# Patient Record
Sex: Male | Born: 1965
Health system: Southern US, Community
[De-identification: ages and names within clinical notes are randomized; demographics above are authoritative.]

## PROBLEM LIST (undated history)

## (undated) DIAGNOSIS — E785 Hyperlipidemia, unspecified: Secondary | ICD-10-CM

## (undated) DIAGNOSIS — N200 Calculus of kidney: Secondary | ICD-10-CM

## (undated) HISTORY — PX: APPENDECTOMY: SHX54

## (undated) HISTORY — PX: LITHOTRIPSY: SUR834

## (undated) HISTORY — DX: Hyperlipidemia, unspecified: E78.5

## (undated) HISTORY — DX: Calculus of kidney: N20.0

---

## 1998-06-19 ENCOUNTER — Encounter: Payer: Self-pay | Admitting: Family Medicine

## 1998-06-19 ENCOUNTER — Ambulatory Visit (HOSPITAL_COMMUNITY): Admission: RE | Admit: 1998-06-19 | Discharge: 1998-06-19 | Payer: Self-pay | Admitting: Family Medicine

## 1998-06-21 ENCOUNTER — Encounter: Admission: RE | Admit: 1998-06-21 | Discharge: 1998-09-19 | Payer: Self-pay

## 2001-06-27 ENCOUNTER — Encounter: Payer: Self-pay | Admitting: Emergency Medicine

## 2001-06-27 ENCOUNTER — Emergency Department (HOSPITAL_COMMUNITY): Admission: EM | Admit: 2001-06-27 | Discharge: 2001-06-27 | Payer: Self-pay | Admitting: Emergency Medicine

## 2002-07-13 ENCOUNTER — Encounter: Payer: Self-pay | Admitting: Orthopedic Surgery

## 2002-07-13 ENCOUNTER — Ambulatory Visit (HOSPITAL_COMMUNITY): Admission: RE | Admit: 2002-07-13 | Discharge: 2002-07-13 | Payer: Self-pay | Admitting: Orthopedic Surgery

## 2009-12-31 ENCOUNTER — Emergency Department (HOSPITAL_COMMUNITY): Admission: EM | Admit: 2009-12-31 | Discharge: 2009-12-31 | Payer: Self-pay | Admitting: Emergency Medicine

## 2010-03-25 ENCOUNTER — Emergency Department (HOSPITAL_COMMUNITY)
Admission: EM | Admit: 2010-03-25 | Discharge: 2010-03-25 | Payer: Self-pay | Source: Home / Self Care | Admitting: Emergency Medicine

## 2010-03-25 LAB — CBC
HCT: 44.5 % (ref 39.0–52.0)
Hemoglobin: 15 g/dL (ref 13.0–17.0)
MCH: 28.9 pg (ref 26.0–34.0)
MCHC: 33.7 g/dL (ref 30.0–36.0)
MCV: 85.7 fL (ref 78.0–100.0)
Platelets: 247 10*3/uL (ref 150–400)
RBC: 5.19 MIL/uL (ref 4.22–5.81)
RDW: 12.6 % (ref 11.5–15.5)
WBC: 7.2 10*3/uL (ref 4.0–10.5)

## 2010-03-25 LAB — DIFFERENTIAL
Basophils Absolute: 0 10*3/uL (ref 0.0–0.1)
Basophils Relative: 0 % (ref 0–1)
Eosinophils Absolute: 0.1 10*3/uL (ref 0.0–0.7)
Eosinophils Relative: 1 % (ref 0–5)
Lymphocytes Relative: 40 % (ref 12–46)
Lymphs Abs: 2.9 10*3/uL (ref 0.7–4.0)
Monocytes Absolute: 0.5 10*3/uL (ref 0.1–1.0)
Monocytes Relative: 7 % (ref 3–12)
Neutro Abs: 3.8 10*3/uL (ref 1.7–7.7)
Neutrophils Relative %: 52 % (ref 43–77)

## 2010-03-25 LAB — POCT CARDIAC MARKERS
CKMB, poc: <1 ng/mL — ABNORMAL LOW (ref 1.0–8.0)
Myoglobin, poc: 77 ng/mL (ref 12–200)
Troponin i, poc: <0.05 ng/mL (ref 0.00–0.09)

## 2010-03-25 LAB — COMPREHENSIVE METABOLIC PANEL
ALT: 26 U/L (ref 0–53)
AST: 24 U/L (ref 0–37)
Albumin: 4 g/dL (ref 3.5–5.2)
CO2: 26 mEq/L (ref 19–32)
Chloride: 101 mEq/L (ref 96–112)
Creatinine, Ser: 0.95 mg/dL (ref 0.4–1.5)
GFR calc Af Amer: >60 mL/min (ref >60)
Potassium: 4.6 mEq/L (ref 3.5–5.1)
Sodium: 135 mEq/L (ref 135–145)
Total Bilirubin: 0.6 mg/dL (ref 0.3–1.2)

## 2013-06-16 ENCOUNTER — Emergency Department (HOSPITAL_COMMUNITY)
Admission: EM | Admit: 2013-06-16 | Discharge: 2013-06-16 | Disposition: A | Discharge status: Home / Self Care | Payer: 59 | Source: Home / Self Care | Attending: Family Medicine | Admitting: Family Medicine

## 2013-06-16 ENCOUNTER — Encounter (HOSPITAL_COMMUNITY): Payer: Self-pay | Admitting: Emergency Medicine

## 2013-06-16 DIAGNOSIS — Z5689 Other problems related to employment: Secondary | ICD-10-CM

## 2013-06-16 DIAGNOSIS — L237 Allergic contact dermatitis due to plants, except food: Secondary | ICD-10-CM

## 2013-06-16 DIAGNOSIS — L255 Unspecified contact dermatitis due to plants, except food: Secondary | ICD-10-CM

## 2013-06-16 MED ORDER — PREDNISONE 5 MG PO KIT: PACK | ORAL | Status: DC

## 2013-06-16 MED ORDER — TRIAMCINOLONE ACETONIDE 0.1 % EX CREA: 1.0000 "application " | TOPICAL_CREAM | Freq: Two times a day (BID) | CUTANEOUS | Status: DC

## 2013-06-16 NOTE — ED Provider Notes (Signed)
Roy Jenkins is a 48 y.o. male who presents to Urgent Care today for rash. Patient was exposed to poison ivy at work. 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 This occurred 2 days ago. Yesterday he developed a rash across trunk and extremities. It is itchy and consistent with prior episodes of poison ivy. He denies any lip or tongue swelling or trouble breathing. No fevers or chills nausea vomiting or diarrhea. No medications tried yet. Symptoms are moderate.   History reviewed. No pertinent past medical history. History  Substance Use Topics  . Smoking status: Never Smoker   . Smokeless tobacco: Not on file  . Alcohol Use: Yes   ROS as above Medications: No current facility-administered medications for this encounter.   Current Outpatient Prescriptions  Medication Sig Dispense Refill  . PredniSONE 5 MG KIT 12 days dosepack po  1 kit  0  . triamcinolone cream (KENALOG) 0.1 % Apply 1 application topically 2 (two) times daily.  30 g  0    Exam:  BP 125/69  Pulse 62  Temp(Src) 97.9 F (36.6 C) (Oral)  Resp 18  SpO2 100% Gen: Well NAD HEENT: EOMI,  MMM no mucocutaneous involvement. Lungs: Normal work of breathing. CTABL Heart: RRR no MRG Abd: NABS, Soft. NT, ND Exts: Brisk capillary refill, warm and well perfused.  Skin: Macular pruritic blanchable rash across trunk and extremities.  No results found for this or any previous visit (from the past 24 hour(s)). No results found.  Assessment and Plan: 48 y.o. male with poison ivy dermatitis. Plan for prednisone dose pack, triamcinolone cream, and Gold Bond Itch.  Discussed warning signs or symptoms. Please see discharge instructions. Patient expresses understanding.    Gregor Hams, MD 06/16/13 1248

## 2013-06-16 NOTE — ED Notes (Signed)
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

## 2013-06-16 NOTE — Discharge Instructions (Signed)
Thank you for coming in today. Take prednisone as directed for 12 days.  Use triamcinolone cream as needed.  Use over-the-counter Gold Bond Itch lotion as needed.  Call or go to the emergency room if you get worse, have trouble breathing, have chest pains, or palpitations.   Poison [employer] ivy is a inflammation of the skin (contact dermatitis) caused by touching the allergens on the leaves of the ivy plant following previous exposure to the plant. The rash usually appears 48 hours after exposure. The rash is usually bumps (papules) or blisters (vesicles) in a linear pattern. Depending on your own sensitivity, the rash may simply cause redness and itching, or it may also progress to blisters which may break open. These must be well cared for to prevent secondary bacterial (germ) infection, followed by scarring. Keep any open areas dry, clean, dressed, and covered with an antibacterial ointment if needed. The eyes may also get puffy. The puffiness is worst in the morning and gets better as the day progresses. This dermatitis usually heals without scarring, within 2 to 3 weeks without treatment. HOME CARE INSTRUCTIONS  Thoroughly wash with soap and water as soon as you have been exposed to poison ivy. You have about one half hour to remove the plant resin before it will cause the rash. This washing will destroy the oil or antigen on the skin that is causing, or will cause, the rash. Be sure to wash under your fingernails as any plant resin there will continue to spread the rash. Do not rub skin vigorously when washing affected area. Poison ivy cannot spread if no oil from the plant remains on your body. A rash that has progressed to weeping sores will not spread the rash unless you have not washed thoroughly. It is also important to wash any clothes you have been wearing as these may carry active allergens. The rash will return if you wear the unwashed clothing, even several days later. Avoidance of the  plant in the future is the best measure. Poison ivy plant can be recognized by the number of leaves. Generally, poison ivy has three leaves with flowering branches on a single stem. Diphenhydramine may be purchased over the counter and used as needed for itching. Do not drive with this medication if it makes you drowsy.Ask your caregiver about medication for children. SEEK MEDICAL CARE IF:  Open sores develop.  Redness spreads beyond area of rash.  You notice purulent (pus-like) discharge.  You have increased pain.  Other signs of infection develop (such as fever). Document Released: 02/09/2000 Document Revised: 05/06/2011 Document Reviewed: 12/28/2008 [facility] Patient Information 2014 [place], [place].

## 2014-03-05 ENCOUNTER — Encounter: Payer: Self-pay | Admitting: *Deleted

## 2014-03-09 ENCOUNTER — Encounter: Payer: Self-pay | Admitting: *Deleted

## 2017-01-02 DIAGNOSIS — Z23 Encounter for immunization: Secondary | ICD-10-CM | POA: Diagnosis not present

## 2017-01-02 DIAGNOSIS — Z0000 Encounter for general adult medical examination without abnormal findings: Secondary | ICD-10-CM | POA: Diagnosis not present

## 2017-01-02 DIAGNOSIS — E7800 Pure hypercholesterolemia, unspecified: Secondary | ICD-10-CM | POA: Diagnosis not present

## 2017-01-21 ENCOUNTER — Ambulatory Visit (INDEPENDENT_AMBULATORY_CARE_PROVIDER_SITE_OTHER): Payer: Self-pay | Admitting: Orthopaedic Surgery

## 2017-01-22 ENCOUNTER — Ambulatory Visit (INDEPENDENT_AMBULATORY_CARE_PROVIDER_SITE_OTHER): Payer: 59 | Admitting: Orthopaedic Surgery

## 2017-01-22 ENCOUNTER — Encounter (INDEPENDENT_AMBULATORY_CARE_PROVIDER_SITE_OTHER): Payer: Self-pay | Admitting: Orthopaedic Surgery

## 2017-01-22 ENCOUNTER — Ambulatory Visit (INDEPENDENT_AMBULATORY_CARE_PROVIDER_SITE_OTHER): Payer: 59

## 2017-01-22 DIAGNOSIS — M25561 Pain in right knee: Secondary | ICD-10-CM

## 2017-01-22 DIAGNOSIS — G8929 Other chronic pain: Secondary | ICD-10-CM | POA: Diagnosis not present

## 2017-01-22 DIAGNOSIS — M7061 Trochanteric bursitis, right hip: Secondary | ICD-10-CM | POA: Diagnosis not present

## 2017-01-22 DIAGNOSIS — M25551 Pain in right hip: Secondary | ICD-10-CM | POA: Diagnosis not present

## 2017-01-22 NOTE — Progress Notes (Signed)
Office Visit Note   Patient: Roy Jenkins           Date of Birth: Sep 15, 1965           MRN: [id number] Visit Date: 01/22/2017              Requested by: Shirline Frees, MD [address] [place], [place] [address] PCP: Shirline Frees, MD   Assessment & Plan: Visit Diagnoses:  1. Pain in right hip   2. Chronic pain of right knee   3. Trochanteric bursitis, right hip     Plan: Discussed with him conservative treatment which includes IT band stretching, quad strengthening and trochanteric injection.  He does not want to have a injection with cortisone today.  Like to try the exercises.  I also suggested that he try an anti-inflammatory chooses to try a natural anti-inflammatory like to record tried cherries.  We will see him back on an as-needed basis pain persist or becomes worse.  Questions were encouraged and answered at length today by Dr. Ninfa Linden and myself.  Follow-Up Instructions: Return if symptoms worsen or fail to improve.   Orders:  Orders Placed This Encounter  Procedures  . XR HIP UNILAT W OR W/O PELVIS 1V RIGHT  . XR Knee 1-2 Views Right   No orders of the defined types were placed in this encounter.     Procedures: No procedures performed   Clinical Data: No additional findings.   Subjective: Right hip and occasional knee pain  HPI Roy Jenkins is a 51 year old male comes in today with chief complaint of right lateral hip pain occasionally anterior hip pain and occasional knee pain.  He denies any radicular symptoms back pain.  States pain is been ongoing for the past year.  He has had no known injury.  He denies any difficulty donning socks or shoes on the right side.  Describes pain as an achy pain no numbness or tingling.  He has had no treatment for work. Review of Systems Positive for right hip right knee pain.  Negative for radicular symptoms back pain.  Otherwise review of systems negative.  Objective: Vital Signs: There were no vitals  taken for this visit.  Physical Exam  Constitutional: He is oriented to person, place, and time. He appears well-developed and well-nourished. No distress.  Pulmonary/Chest: Effort normal.  Neurological: He is alert and oriented to person, place, and time.  Skin: He is not diaphoretic.  Psychiatric: He has a normal mood and affect.    Ortho Exam Bilateral hips he has excellent range of motion of both hips without pain.  There is tenderness over the right trochanteric region.  No tenderness over the left trochanteric region.  Has good flexion of both hips without pain.  Negative straight leg raise bilaterally.  Bilateral knees no abnormal warmth erythema or effusion.  No instability valgus varus stressing of either knee.  No tenderness along medial lateral joint line of either knee.  Full range of motion of both knees without pain.  Passive range of motion of both knees reveals patellofemoral crepitus bilaterally.  McMurray's is negative bilaterally. Specialty Comments:  No specialty comments available.  Imaging: Xr Hip Unilat W Or W/o Pelvis 1v Right  Result Date: 01/22/2017 AP pelvis and lateral view of the right hip.  No acute fracture.  Bilateral hips are well maintained on the AP pelvis.  No bony abnormalities.  Bilateral hips are well located.  Xr Knee 1-2 Views Right  Result Date: 01/22/2017 Right knee AP lateral views.  Mild patellofemoral changes.  Otherwise knee is well-maintained.  No dislocation or subluxation.  No bony abnormalities.  No acute fracture    PMFS History: There are no active problems to display for this patient.  Past Medical History:  Diagnosis Date  . Hyperlipidemia   . Kidney stones     No family history on file.  No past surgical history on file. Social History   Occupational History  . Not on file  Tobacco Use  . Smoking status: Former Smoker    Last attempt to quit: 02/26/1992    Years since quitting: 24.9  Substance and Sexual Activity  .  Alcohol use: Yes    Alcohol/week: 2.4 - 3.6 oz    Types: 2 - 3 Glasses of wine, 2 - 3 Cans of beer per week  . Drug use: No  . Sexual activity: Not on file

## 2017-01-22 NOTE — Progress Notes (Signed)
I was able to see and examine the patient in length and agree with Benita Stabile, PA-C's note as well as assessment and plan.

## 2017-12-30 DIAGNOSIS — M109 Gout, unspecified: Secondary | ICD-10-CM | POA: Diagnosis not present

## 2018-01-05 DIAGNOSIS — E7800 Pure hypercholesterolemia, unspecified: Secondary | ICD-10-CM | POA: Diagnosis not present

## 2018-01-05 DIAGNOSIS — Z0000 Encounter for general adult medical examination without abnormal findings: Secondary | ICD-10-CM | POA: Diagnosis not present

## 2018-03-11 DIAGNOSIS — I83813 Varicose veins of bilateral lower extremities with pain: Secondary | ICD-10-CM | POA: Diagnosis not present

## 2018-04-13 DIAGNOSIS — D120 Benign neoplasm of cecum: Secondary | ICD-10-CM | POA: Diagnosis not present

## 2018-04-13 DIAGNOSIS — D123 Benign neoplasm of transverse colon: Secondary | ICD-10-CM | POA: Diagnosis not present

## 2018-04-13 DIAGNOSIS — Z1211 Encounter for screening for malignant neoplasm of colon: Secondary | ICD-10-CM | POA: Diagnosis not present

## 2018-04-13 DIAGNOSIS — D122 Benign neoplasm of ascending colon: Secondary | ICD-10-CM | POA: Diagnosis not present

## 2018-12-06 ENCOUNTER — Encounter (HOSPITAL_COMMUNITY): Payer: Self-pay | Admitting: Emergency Medicine

## 2018-12-06 ENCOUNTER — Emergency Department (HOSPITAL_COMMUNITY)
Admission: EM | Admit: 2018-12-06 | Discharge: 2018-12-06 | Disposition: A | Discharge status: Home / Self Care | Payer: 59 | Attending: Emergency Medicine | Admitting: Emergency Medicine

## 2018-12-06 DIAGNOSIS — Z79899 Other long term (current) drug therapy: Secondary | ICD-10-CM | POA: Diagnosis not present

## 2018-12-06 DIAGNOSIS — Z87891 Personal history of nicotine dependence: Secondary | ICD-10-CM | POA: Insufficient documentation

## 2018-12-06 DIAGNOSIS — U071 COVID-19: Secondary | ICD-10-CM | POA: Insufficient documentation

## 2018-12-06 DIAGNOSIS — R05 Cough: Secondary | ICD-10-CM | POA: Diagnosis present

## 2018-12-06 DIAGNOSIS — R059 Cough, unspecified: Secondary | ICD-10-CM

## 2018-12-06 DIAGNOSIS — Z20822 Contact with and (suspected) exposure to covid-19: Secondary | ICD-10-CM

## 2018-12-06 DIAGNOSIS — Z20828 Contact with and (suspected) exposure to other viral communicable diseases: Secondary | ICD-10-CM

## 2018-12-06 MED ORDER — BENZONATATE 100 MG PO CAPS: 100.0000 mg | ORAL_CAPSULE | Freq: Three times a day (TID) | ORAL | 0 refills | Status: DC

## 2018-12-06 NOTE — Discharge Instructions (Signed)
You may take Tessalon Perles as needed for cough.  I would also suggest rotating Tylenol and ibuprofen as needed for fever, body aches and pains.  Do not take more than 4000 mg of Tylenol or more than 2400 mg ibuprofen daily.  If you develop high fever uncontrolled with Tylenol and ibuprofen, intractable vomiting, chest pain, shortness of breath, coughing up blood please seek reevaluation the emergency department.

## 2018-12-06 NOTE — ED Triage Notes (Signed)
Pt here for a covid test

## 2018-12-06 NOTE — ED Provider Notes (Signed)
[facility] EMERGENCY DEPARTMENT Provider Note   CSN: [id number] Arrival date & time: 12/06/18  2025    History   Chief Complaint Needs COVID test  HPI Roy Jenkins is a 53 y.o. male with past medical history significant for hyperlipidemia who presents for evaluation of needing COVID test.  Patient states he has had "all the COVID symptoms."  Patient had a gradual headache 4 days ago.  The headache resolved however he has had congestion, rhinorrhea, nonproductive cough.  He has lost his sense of taste and smell.  He is been tolerating p.o. intake at home.  He does work on [employer] however has no known COVID exposures.  He called his PCP office who told him he be tested for COVID.  Denies fever, sudden onset thunderclap headache, unilateral weakness, sore throat, chest pain, shortness of breath, abdominal pain, diarrhea, dysuria, weakness.  He has been taking ibuprofen every 8 hours for his symptoms.  Patient states "I feel fine I just need to test."  He does not want any imaging at this time.  History obtained from patient and past medical records.  No interpreter was used.     HPI  Past Medical History:  Diagnosis Date   Hyperlipidemia    Kidney stones     There are no active problems to display for this patient.   History reviewed. No pertinent surgical history.      Home Medications    Prior to Admission medications   Medication Sig Start Date End Date Taking? Authorizing Provider  Cholecalciferol (VITAMIN D) 2000 UNITS tablet Take 2,000 Units by mouth daily.    [provider]  Omega-3 Fatty Acids (FISH OIL) 1000 MG CAPS Take 1,000 mg by mouth daily.    [provider]  PredniSONE 5 MG KIT 12 days dosepack po Patient taking differently: Take 5 mg by mouth. 12 days dosepack po 06/16/13   Gregor Hams, MD  triamcinolone cream (KENALOG) 0.1 % Apply 1 application topically 2 (two) times daily. 06/16/13   Gregor Hams, MD    Family  History No family history on file.  Social History Social History   Tobacco Use   Smoking status: Former Smoker    Quit date: 02/26/1992    Years since quitting: 26.7  Substance Use Topics   Alcohol use: Yes    Alcohol/week: 4.0 - 6.0 standard drinks    Types: 2 - 3 Glasses of wine, 2 - 3 Cans of beer per week   Drug use: No     Allergies   Patient has no known allergies.   Review of Systems Review of Systems  Constitutional: Positive for chills. Negative for activity change, fatigue and fever.  HENT: Positive for congestion, postnasal drip and rhinorrhea. Negative for drooling, ear discharge, ear pain, facial swelling, sinus pressure, sinus pain, sneezing, sore throat, trouble swallowing and voice change.   Eyes: Negative.   Respiratory: Positive for cough. Negative for apnea, choking, chest tightness, shortness of breath, wheezing and stridor.   Cardiovascular: Negative.   Gastrointestinal: Negative.   Genitourinary: Negative.   Musculoskeletal: Negative.   Skin: Negative.   Neurological: Negative.   All other systems reviewed and are negative.    Physical Exam Updated Vital Signs BP 121/87    Pulse 76    Temp 99.5 F (37.5 C) (Oral)    Resp 18    SpO2 97%   Physical Exam Vitals signs and nursing note reviewed.  Constitutional:  General: He is not in acute distress.    Appearance: He is not ill-appearing, toxic-appearing or diaphoretic.  HENT:     Head: Normocephalic and atraumatic.     Jaw: There is normal jaw occlusion.     Right Ear: Tympanic membrane, ear canal and external ear normal. There is no impacted cerumen. No hemotympanum. Tympanic membrane is not injected, scarred, perforated, erythematous, retracted or bulging.     Left Ear: Tympanic membrane, ear canal and external ear normal. There is no impacted cerumen. No hemotympanum. Tympanic membrane is not injected, scarred, perforated, erythematous, retracted or bulging.     Ears:     Comments: No  Mastoid tenderness.    Nose:     Comments: Clear rhinorrhea and congestion to bilateral nares.  No sinus tenderness.    Mouth/Throat:     Comments: Posterior oropharynx clear.  Mucous membranes moist.  Tonsils without erythema or exudate.  Uvula midline without deviation.  No evidence of PTA or RPA.  No drooling, dysphasia or trismus.  Phonation normal. Neck:     Trachea: Trachea and phonation normal.     Meningeal: Brudzinski's sign and Kernig's sign absent.     Comments: No Neck stiffness or neck rigidity.  No meningismus.  No cervical lymphadenopathy. Cardiovascular:     Comments: No murmurs rubs or gallops. Pulmonary:     Comments: Clear to auscultation bilaterally without wheeze, rhonchi or rales.  No accessory muscle usage.  Able speak in full sentences. Abdominal:     Comments: Soft, nontender without rebound or guarding.  No CVA tenderness.  Musculoskeletal:     Comments: Moves all 4 extremities without difficulty.  Lower extremities without edema, erythema or warmth.  Skin:    Comments: Brisk capillary refill.  No rashes or lesions.  Neurological:     Mental Status: He is alert.     Comments: Ambulatory in department without difficulty.  Cranial nerves II through XII grossly intact.  No facial droop.  No aphasia.      ED Treatments / Results  Labs (all labs ordered are listed, but only abnormal results are displayed) Labs Reviewed  NOVEL CORONAVIRUS, NAA (HOSP ORDER, SEND-OUT TO REF LAB; TAT 18-24 HRS)    EKG None  Radiology No results found.  Procedures Procedures (including critical care time)  Medications Ordered in ED Medications - No data to display   Initial Impression / Assessment and Plan / ED Course  I have reviewed the triage vital signs and the nursing notes.  Pertinent labs & imaging results that were available during my care of the patient were reviewed by me and considered in my medical decision making (see chart for details).   53 year old  male appears otherwise well presents for evaluation needing COVID test.  He is afebrile, nonseptic, non-ill-appearing.  Already p.o. intake, actually drinking coffee on my initial exam.  Heart and lungs clear.  Abdomen soft, nontender without rebound or guarding.  Did initially have a headache however stated this was gradual in onset.  Resolved with ibuprofen.  He is not currently have a headache.  Normal neurologic exam without deficits.  Low suspicion for CVA, SAH, meningitis as cause of his prior headache.  Patient will receive outpatient COVID testing.  He appears overall well.  Discussed home isolation and strict return precautions.  Patient family voiced understanding room and agreeable for follow-up.  The patient has been appropriately medically screened and/or stabilized in the ED. I have low suspicion for any other emergent medical  condition which would require further screening, evaluation or treatment in the ED or require inpatient management.  Patient is hemodynamically stable and in no acute distress.  Patient able to ambulate in department prior to ED.  Evaluation does not show acute pathology that would require ongoing or additional emergent interventions while in the emergency department or further inpatient treatment.  I have discussed the diagnosis with the patient and answered all questions.  Pain is been managed while in the emergency department and patient has no further complaints prior to discharge.  Patient is comfortable with plan discussed in room and is stable for discharge at this time.  I have discussed strict return precautions for returning to the emergency department.  Patient was encouraged to follow-up with PCP/specialist refer to at discharge.  Given symptomatic treatment.  Given patient is afebrile, nonproductive cough.  Discussed chest imaging with plain x-ray.  Patient is not any changing at this time.  Given he appears overall well I low suspicion for bacterial pneumonia as  cause of his cough.  Patient also denies hemoptysis, chest pain, shortness of breath.  I have low suspicion for pneumothorax, PE as cause of cough.       Roy Jenkins was evaluated in Emergency Department on 12/06/2018 for the symptoms described in the history of present illness. He was evaluated in the context of the global COVID-19 pandemic, which necessitated consideration that the patient might be at risk for infection with the SARS-CoV-2 virus that causes COVID-19. Institutional protocols and algorithms that pertain to the evaluation of patients at risk for COVID-19 are in a state of rapid change based on information released by regulatory bodies including the CDC and federal and state organizations. These policies and algorithms were followed during the patient's care in the ED. Final Clinical Impressions(s) / ED Diagnoses   Final diagnoses:  Encounter for laboratory testing for COVID-19 virus  Cough    ED Discharge Orders    None       Sherelle Castelli A, PA-C 12/06/18 [place], DO 12/06/18 1127

## 2018-12-07 LAB — NOVEL CORONAVIRUS, NAA (HOSP ORDER, SEND-OUT TO REF LAB; TAT 18-24 HRS): SARS-CoV-2, NAA: DETECTED — AB

## 2019-04-02 ENCOUNTER — Encounter (HOSPITAL_COMMUNITY): Payer: Self-pay | Admitting: *Deleted

## 2019-04-02 ENCOUNTER — Other Ambulatory Visit: Payer: Self-pay

## 2019-04-02 ENCOUNTER — Emergency Department (HOSPITAL_COMMUNITY): Payer: 59

## 2019-04-02 ENCOUNTER — Emergency Department (HOSPITAL_COMMUNITY)
Admission: EM | Admit: 2019-04-02 | Discharge: 2019-04-02 | Disposition: A | Discharge status: Home / Self Care | Payer: 59 | Attending: Emergency Medicine | Admitting: Emergency Medicine

## 2019-04-02 DIAGNOSIS — N134 Hydroureter: Secondary | ICD-10-CM | POA: Insufficient documentation

## 2019-04-02 DIAGNOSIS — N200 Calculus of kidney: Secondary | ICD-10-CM

## 2019-04-02 DIAGNOSIS — Z87891 Personal history of nicotine dependence: Secondary | ICD-10-CM | POA: Diagnosis not present

## 2019-04-02 DIAGNOSIS — Z79899 Other long term (current) drug therapy: Secondary | ICD-10-CM | POA: Insufficient documentation

## 2019-04-02 DIAGNOSIS — R110 Nausea: Secondary | ICD-10-CM | POA: Insufficient documentation

## 2019-04-02 DIAGNOSIS — N132 Hydronephrosis with renal and ureteral calculous obstruction: Secondary | ICD-10-CM | POA: Diagnosis not present

## 2019-04-02 DIAGNOSIS — R1031 Right lower quadrant pain: Secondary | ICD-10-CM | POA: Diagnosis present

## 2019-04-02 LAB — URINALYSIS, ROUTINE W REFLEX MICROSCOPIC
Bacteria, UA: NONE SEEN
Bilirubin Urine: NEGATIVE
Glucose, UA: NEGATIVE mg/dL
Ketones, ur: 5 mg/dL — AB
Leukocytes,Ua: NEGATIVE
Nitrite: NEGATIVE
Protein, ur: NEGATIVE mg/dL
Specific Gravity, Urine: 1.019 (ref 1.005–1.030)
pH: 7 (ref 5.0–8.0)

## 2019-04-02 LAB — CBC WITH DIFFERENTIAL/PLATELET
Abs Immature Granulocytes: 0.02 10*3/uL (ref 0.00–0.07)
Basophils Absolute: 0.1 10*3/uL (ref 0.0–0.1)
Basophils Relative: 1 %
Eosinophils Absolute: 0.1 10*3/uL (ref 0.0–0.5)
Eosinophils Relative: 1 %
HCT: 51 % (ref 39.0–52.0)
Hemoglobin: 16.8 g/dL (ref 13.0–17.0)
Immature Granulocytes: 0 %
Lymphocytes Relative: 39 %
Lymphs Abs: 3.6 10*3/uL (ref 0.7–4.0)
MCH: 29.5 pg (ref 26.0–34.0)
MCHC: 32.9 g/dL (ref 30.0–36.0)
MCV: 89.6 fL (ref 80.0–100.0)
Monocytes Absolute: 0.6 10*3/uL (ref 0.1–1.0)
Monocytes Relative: 7 %
Neutro Abs: 4.8 10*3/uL (ref 1.7–7.7)
Neutrophils Relative %: 52 %
Platelets: 335 10*3/uL (ref 150–400)
RBC: 5.69 MIL/uL (ref 4.22–5.81)
RDW: 12.9 % (ref 11.5–15.5)
WBC: 9.2 10*3/uL (ref 4.0–10.5)
nRBC: 0 % (ref 0.0–0.2)

## 2019-04-02 LAB — BASIC METABOLIC PANEL
Anion gap: 10 (ref 5–15)
BUN: 12 mg/dL (ref 6–20)
CO2: 26 mmol/L (ref 22–32)
Calcium: 9.6 mg/dL (ref 8.9–10.3)
Chloride: 105 mmol/L (ref 98–111)
Creatinine, Ser: 1.06 mg/dL (ref 0.61–1.24)
GFR calc Af Amer: >60 mL/min (ref >60)
GFR calc non Af Amer: >60 mL/min (ref >60)
Glucose, Bld: 108 mg/dL — ABNORMAL HIGH (ref 70–99)
Potassium: 4 mmol/L (ref 3.5–5.1)
Sodium: 141 mmol/L (ref 135–145)

## 2019-04-02 MED ORDER — ONDANSETRON HCL 4 MG/2ML IJ SOLN
4.0000 mg | Freq: Once | INTRAMUSCULAR | Status: AC
  Administered 2019-04-02: 08:00:00 4 mg via INTRAVENOUS
  Filled 2019-04-02: qty 2

## 2019-04-02 MED ORDER — HYDROMORPHONE HCL 1 MG/ML IJ SOLN
1.0000 mg | Freq: Once | INTRAMUSCULAR | Status: AC
  Administered 2019-04-02: 08:00:00 1 mg via INTRAVENOUS
  Filled 2019-04-02: qty 1

## 2019-04-02 MED ORDER — KETOROLAC TROMETHAMINE 15 MG/ML IJ SOLN
15.0000 mg | Freq: Once | INTRAMUSCULAR | Status: AC
  Administered 2019-04-02: 15 mg via INTRAVENOUS
  Filled 2019-04-02: qty 1

## 2019-04-02 MED ORDER — HYDROMORPHONE HCL 1 MG/ML IJ SOLN
1.0000 mg | Freq: Once | INTRAMUSCULAR | Status: AC
  Administered 2019-04-02: 1 mg via INTRAVENOUS
  Filled 2019-04-02: qty 1

## 2019-04-02 MED ORDER — HYDROCODONE-ACETAMINOPHEN 5-325 MG PO TABS
1.0000 | ORAL_TABLET | Freq: Once | ORAL | Status: AC
  Administered 2019-04-02: 1 via ORAL
  Filled 2019-04-02: qty 1

## 2019-04-02 MED ORDER — HYDROCODONE-ACETAMINOPHEN 5-325 MG PO TABS: 1.0000 | ORAL_TABLET | ORAL | 0 refills | Status: DC | PRN

## 2019-04-02 MED ORDER — SODIUM CHLORIDE 0.9 % IV BOLUS
500.0000 mL | Freq: Once | INTRAVENOUS | Status: AC
Start: 2019-04-02 — End: 2019-04-02
  Administered 2019-04-02: 11:00:00 500 mL via INTRAVENOUS

## 2019-04-02 NOTE — ED Triage Notes (Signed)
PT with acute onset L flank pain and nausea.

## 2019-04-02 NOTE — ED Notes (Addendum)
Ask patient for urine sample, patient stated that he did not need to urinate at the time.

## 2019-04-02 NOTE — ED Provider Notes (Signed)
[facility] EMERGENCY DEPARTMENT Provider Note   CSN: [id number] Arrival date & time: 04/02/19  0626     History No chief complaint on file.   Roy Jenkins is a 54 y.o. male.  The history is provided by the patient.  Flank Pain This is a new problem. The current episode started less than 1 hour ago. The problem occurs constantly. The problem has not changed since onset.Associated symptoms comments: Right flank pain radiating to the RLQ with sharp, stabbing and cramping pain causing nausea but has not vomited.  Felt fine yesterday and was ok when woke up this morning but on the way to work the pain started.  No fever, cough, SOB.  No diarrhea or dysuria.. Nothing aggravates the symptoms. Nothing relieves the symptoms. He has tried nothing for the symptoms. The treatment provided no relief.       Past Medical History:  Diagnosis Date  . Hyperlipidemia   . Kidney stones     There are no problems to display for this patient.   No past surgical history on file.     No family history on file.  Social History   Tobacco Use  . Smoking status: Former Smoker    Quit date: 02/26/1992    Years since quitting: 27.1  Substance Use Topics  . Alcohol use: Yes    Alcohol/week: 4.0 - 6.0 standard drinks    Types: 2 - 3 Glasses of wine, 2 - 3 Cans of beer per week  . Drug use: No    Home Medications Prior to Admission medications   Medication Sig Start Date End Date Taking? Authorizing Provider  benzonatate (TESSALON) 100 MG capsule Take 1 capsule (100 mg total) by mouth every 8 (eight) hours. 12/06/18   Henderly, Britni A, PA-C  Cholecalciferol (VITAMIN D) 2000 UNITS tablet Take 2,000 Units by mouth daily.    [provider]  Omega-3 Fatty Acids (FISH OIL) 1000 MG CAPS Take 1,000 mg by mouth daily.    [provider]  PredniSONE 5 MG KIT 12 days dosepack po Patient taking differently: Take 5 mg by mouth. 12 days dosepack po 06/16/13   Gregor Hams, MD  triamcinolone cream (KENALOG) 0.1 % Apply 1 application topically 2 (two) times daily. 06/16/13   Gregor Hams, MD    Allergies    Patient has no known allergies.  Review of Systems   Review of Systems  Genitourinary: Positive for flank pain.  All other systems reviewed and are negative.   Physical Exam Updated Vital Signs BP (!) 143/93 (BP Location: Right Arm)   Pulse 94   Temp 98.4 F (36.9 C) (Oral)   Resp 16   Ht '6\' 2"'  (1.88 m)   Wt 113.4 kg   SpO2 100%   BMI 32.10 kg/m   Physical Exam Vitals and nursing note reviewed.  Constitutional:      General: He is in acute distress.     Appearance: He is well-developed. He is diaphoretic.  HENT:     Head: Normocephalic and atraumatic.  Eyes:     Conjunctiva/sclera: Conjunctivae normal.     Pupils: Pupils are equal, round, and reactive to light.  Cardiovascular:     Rate and Rhythm: Normal rate and regular rhythm.     Heart sounds: No murmur.  Pulmonary:     Effort: Pulmonary effort is normal. No respiratory distress.     Breath sounds: Normal breath sounds. No wheezing or rales.  Abdominal:     General: There is no distension.     Palpations: Abdomen is soft.     Tenderness: There is abdominal tenderness in the right lower quadrant. There is right CVA tenderness. There is no guarding or rebound.  Musculoskeletal:        General: No tenderness. Normal range of motion.     Cervical back: Normal range of motion and neck supple.  Skin:    General: Skin is warm.     Coloration: Skin is pale.     Findings: No erythema or rash.  Neurological:     General: No focal deficit present.     Mental Status: He is alert and oriented to person, place, and time. Mental status is at baseline.  Psychiatric:        Mood and Affect: Mood normal.        Behavior: Behavior normal.        Thought Content: Thought content normal.     ED Results / Procedures / Treatments   Labs (all labs ordered are listed, but only abnormal  results are displayed) Labs Reviewed  BASIC METABOLIC PANEL - Abnormal; Notable for the following components:      Result Value   Glucose, Bld 108 (*)    All other components within normal limits  URINALYSIS, ROUTINE W REFLEX MICROSCOPIC - Abnormal; Notable for the following components:   APPearance HAZY (*)    Hgb urine dipstick MODERATE (*)    Ketones, ur 5 (*)    All other components within normal limits  CBC WITH DIFFERENTIAL/PLATELET    EKG None  Radiology CT Renal Stone Study  Result Date: 04/02/2019 CLINICAL DATA:  Right flank pain for 2 days EXAM: CT ABDOMEN AND PELVIS WITHOUT CONTRAST TECHNIQUE: Multidetector CT imaging of the abdomen and pelvis was performed following the standard protocol without IV contrast. COMPARISON:  None. FINDINGS: Lower chest: No acute abnormality. Hepatobiliary: No focal liver abnormality is seen. No gallstones, gallbladder wall thickening, or biliary dilatation. Pancreas: Mild fatty infiltration of the pancreatic head and uncinate process. No peripancreatic inflammatory stranding. No pancreatic ductal dilatation. Spleen: Normal in size without focal abnormality. Adrenals/Urinary Tract: Unremarkable adrenal glands. 6 x 3 mm calculus at the right ureterovesical junction resulting in mild right hydroureteronephrosis. Nonobstructing 3 mm calculus within the midpole of the left kidney. No left-sided hydronephrosis. Left ureter is unremarkable. Urinary bladder is unremarkable for the degree of distension. Stomach/Bowel: Small hiatal hernia. Stomach is otherwise within normal limits. Appendix is not visualized and may be surgically absent. No evidence of bowel wall thickening, distention, or inflammatory changes. Vascular/Lymphatic: No significant vascular findings are present. No enlarged abdominal or pelvic lymph nodes. Reproductive: Prostate is unremarkable. Other: No abdominal wall hernia or abnormality. No abdominopelvic ascites. Musculoskeletal: No acute or  significant osseous findings. Degenerative disc disease of L5-S1. IMPRESSION: 1. Obstructing 6 mm calculus at the right UVJ resulting in mild right hydroureteronephrosis. 2. Nonobstructing 3 mm calculus within the midpole of the left kidney. Electronically Signed   By: Davina Poke D.O.   On: 04/02/2019 09:23    Procedures Procedures (including critical care time)  Medications Ordered in ED Medications  ondansetron (ZOFRAN) injection 4 mg (has no administration in time range)  HYDROmorphone (DILAUDID) injection 1 mg (has no administration in time range)    ED Course  I have reviewed the triage vital signs and the nursing notes.  Pertinent labs & imaging results that were available during my care of the patient  were reviewed by me and considered in my medical decision making (see chart for details).    MDM Rules/Calculators/A&P                      Pt with symptoms consistent with kidney stone.  Denies infectious sx, or GI symptoms.  Low concern for diverticulitis and no risk factors or history suggestive of AAA.  No hx suggestive of GU source (discharge) and otherwise pt is healthy.  Will  treat pain and ensure no infection with UA, CBC, CMP and will get stone study to further eval.  9:32 AM CBC, BMP are within normal limits.  Renal study shows an obstructing 6 mm calculus at the right UVJ with mild right hydronephrosis.  On reevaluation after 2 mg of Dilaudid patient is still uncomfortable and states his pain is getting worse.  Patient given Toradol.  Still waiting for urine.  2:01 PM Urine with blood but no other signs of abnormalities.  Pain improved and pt d/ced home.  Final Clinical Impression(s) / ED Diagnoses Final diagnoses:  Kidney stone    Rx / DC Orders ED Discharge Orders         Ordered    HYDROcodone-acetaminophen (NORCO/VICODIN) 5-325 MG tablet  Every 4 hours PRN     04/02/19 1403           Blanchie Dessert, MD 04/02/19 1404

## 2019-04-02 NOTE — ED Notes (Signed)
Called and updated wife.  Called CT to notify them pt's pain is controlled enough to lay still.

## 2019-04-02 NOTE — ED Notes (Signed)
Pt verbalized understanding of discharge instructions. Follow up care and prescriptions reviewed. Pt ambulated independently to lobby.

## 2019-06-22 ENCOUNTER — Other Ambulatory Visit: Payer: Self-pay

## 2019-06-22 ENCOUNTER — Ambulatory Visit (HOSPITAL_COMMUNITY)
Admission: EM | Admit: 2019-06-22 | Discharge: 2019-06-22 | Disposition: A | Discharge status: Home / Self Care | Payer: 59 | Attending: Family Medicine | Admitting: Family Medicine

## 2019-06-22 DIAGNOSIS — S61215A Laceration without foreign body of left ring finger without damage to nail, initial encounter: Secondary | ICD-10-CM

## 2019-06-22 DIAGNOSIS — Z23 Encounter for immunization: Secondary | ICD-10-CM | POA: Diagnosis not present

## 2019-06-22 MED ORDER — TETANUS-DIPHTH-ACELL PERTUSSIS 5-2.5-18.5 LF-MCG/0.5 IM SUSP
INTRAMUSCULAR | Status: AC
  Filled 2019-06-22: qty 0.5

## 2019-06-22 MED ORDER — TETANUS-DIPHTH-ACELL PERTUSSIS 5-2.5-18.5 LF-MCG/0.5 IM SUSP
0.5000 mL | Freq: Once | INTRAMUSCULAR | Status: AC
  Administered 2019-06-22: 19:00:00 0.5 mL via INTRAMUSCULAR

## 2019-06-22 NOTE — ED Triage Notes (Signed)
Laceration to L ring finger from box cutter

## 2019-06-23 NOTE — ED Provider Notes (Signed)
[facility]    CSN: [id number] Arrival date & time: 06/22/19  1732      History   Chief Complaint Chief Complaint  Patient presents with  . Laceration    HPI Roy Jenkins is a 54 y.o. male presenting today for evaluation of a laceration.  Patient was using a box cutter this evening and cut his left ring finger.  He denies difficulty bending his finger.  He denies anticoagulant use.  He is unsure of his last tetanus, likely greater than 5 years.  HPI  Past Medical History:  Diagnosis Date  . Hyperlipidemia   . Kidney stones     There are no problems to display for this patient.   Past Surgical History:  Procedure Laterality Date  . APPENDECTOMY    . LITHOTRIPSY         Home Medications    Prior to Admission medications   Medication Sig Start Date End Date Taking? Authorizing Provider  benzonatate (TESSALON) 100 MG capsule Take 1 capsule (100 mg total) by mouth every 8 (eight) hours. 12/06/18   Henderly, Britni A, PA-C  Cholecalciferol (VITAMIN D) 2000 UNITS tablet Take 2,000 Units by mouth daily.    [provider]  HYDROcodone-acetaminophen (NORCO/VICODIN) 5-325 MG tablet Take 1-2 tablets by mouth every 4 (four) hours as needed for severe pain. 04/02/19   Blanchie Dessert, MD  Omega-3 Fatty Acids (FISH OIL) 1000 MG CAPS Take 1,000 mg by mouth daily.    [provider]  PredniSONE 5 MG KIT 12 days dosepack po Patient taking differently: Take 5 mg by mouth. 12 days dosepack po 06/16/13   Gregor Hams, MD  triamcinolone cream (KENALOG) 0.1 % Apply 1 application topically 2 (two) times daily. 06/16/13   Gregor Hams, MD    Family History No family history on file.  Social History Social History   Tobacco Use  . Smoking status: Former Smoker    Quit date: 02/26/1992    Years since quitting: 27.3  . Smokeless tobacco: Never Used  Substance Use Topics  . Alcohol use: Yes    Alcohol/week: 4.0 - 6.0 standard drinks    Types: 2 - 3  Glasses of wine, 2 - 3 Cans of beer per week  . Drug use: No     Allergies   Patient has no known allergies.   Review of Systems Review of Systems  Constitutional: Negative for fatigue and fever.  Eyes: Negative for redness, itching and visual disturbance.  Respiratory: Negative for shortness of breath.   Cardiovascular: Negative for chest pain and leg swelling.  Gastrointestinal: Negative for nausea and vomiting.  Musculoskeletal: Negative for arthralgias and myalgias.  Skin: Positive for wound. Negative for color change and rash.  Neurological: Negative for dizziness, syncope, weakness, light-headedness and headaches.     Physical Exam Triage Vital Signs ED Triage Vitals [06/22/19 1813]  Enc Vitals Group     BP      Pulse Rate 69     Resp 16     Temp 98 F (36.7 C)     Temp src      SpO2 100 %     Weight      Height      Head Circumference      Peak Flow      Pain Score 0     Pain Loc      Pain Edu?      Excl. in [place]?    No  data found.  Updated Vital Signs Pulse 69   Temp 98 F (36.7 C)   Resp 16   SpO2 100%   Visual Acuity Right Eye Distance:   Left Eye Distance:   Bilateral Distance:    Right Eye Near:   Left Eye Near:    Bilateral Near:     Physical Exam Vitals and nursing note reviewed.  Constitutional:      Appearance: He is well-developed.     Comments: No acute distress  HENT:     Head: Normocephalic and atraumatic.     Nose: Nose normal.  Eyes:     Conjunctiva/sclera: Conjunctivae normal.  Cardiovascular:     Rate and Rhythm: Normal rate.  Pulmonary:     Effort: Pulmonary effort is normal. No respiratory distress.  Abdominal:     General: There is no distension.  Musculoskeletal:        General: Normal range of motion.     Cervical back: Neck supple.     Comments: Left ring finger: Full active range of motion at DIP and PIP, cap refill brisk, radial pulse 2+  Skin:    General: Skin is warm and dry.     Comments: 1.25 cm linear  laceration noted to distal phalanx of left ring finger near DIP  Neurological:     Mental Status: He is alert and oriented to person, place, and time.      UC Treatments / Results  Labs (all labs ordered are listed, but only abnormal results are displayed) Labs Reviewed - No data to display  EKG   Radiology No results found.  Procedures Laceration Repair  Date/Time: 06/22/2019 6:57 PM Performed by: Mystique Bjelland, Elesa Hacker, PA-C Authorized by: Raylene Everts, MD   Consent:    Consent obtained:  Verbal   Consent given by:  Patient   Risks discussed:  Infection and pain Anesthesia (see MAR for exact dosages):    Anesthesia method:  Local infiltration   Local anesthetic:  Lidocaine 2% w/o epi Laceration details:    Location:  Finger   Finger location:  L ring finger   Length (cm):  1.3   Depth (mm):  2 Repair type:    Repair type:  Simple Pre-procedure details:    Preparation:  Patient was prepped and draped in usual sterile fashion Exploration:    Hemostasis achieved with:  Direct pressure   Wound extent: no foreign bodies/material noted, no tendon damage noted and no underlying fracture noted     Contaminated: no   Treatment:    Area cleansed with:  Soap and water   Amount of cleaning:  Standard   Irrigation solution:  Sterile water   Irrigation volume:  250 cc   Irrigation method:  Syringe   Visualized foreign bodies/material removed: no   Skin repair:    Repair method:  Sutures   Suture size:  5-0   Suture material:  Prolene   Suture technique:  Simple interrupted   Number of sutures:  3 Approximation:    Approximation:  Close Post-procedure details:    Dressing:  Non-adherent dressing   Patient tolerance of procedure:  Tolerated well, no immediate complications   (including critical care time)  Medications Ordered in UC Medications  Tdap (BOOSTRIX) injection 0.5 mL (0.5 mLs Intramuscular Given 06/22/19 1844)    Initial Impression / Assessment and  Plan / UC Course  I have reviewed the triage vital signs and the nursing notes.  Pertinent labs & imaging results  that were available during my care of the patient were reviewed by me and considered in my medical decision making (see chart for details).     Opted for suturing over Dermabond given location over joint.  Discussed wound care, sutures to be removed in 1 week.  Tetanus updated today.  Monitor for any signs of infection.  Discussed strict return precautions. Patient verbalized understanding and is agreeable with plan.  Final Clinical Impressions(s) / UC Diagnoses   Final diagnoses:  Laceration of left ring finger without foreign body without damage to nail, initial encounter     Discharge Instructions     WOUND CARE Please return in 7 days to have your stitches/staples removed or sooner if you have concerns. Marland Kitchen Keep area clean and dry for 24 hours. Do not remove bandage, if applied. . After 24 hours, remove bandage and wash wound gently with mild soap and warm water. Reapply a new bandage after cleaning wound, if directed. . Continue daily cleansing with soap and water until stitches/staples are removed. . Do not apply any ointments or creams to the wound while stitches/staples are in place, as this may cause delayed healing. . Notify the office if you experience any of the following signs of infection: Swelling, redness, pus drainage, streaking, fever >101.0 F . Notify the office if you experience excessive bleeding that does not stop after 15-20 minutes of constant, firm pressure.   ED Prescriptions    None     PDMP not reviewed this encounter.   Janith Lima, [place] 06/23/19 1159

## 2020-10-09 IMAGING — CT CT RENAL STONE PROTOCOL
2 of 4 series · 17 of 46 positions shown, 19 images · non-contrast
Comparison: None.

CLINICAL DATA: Right flank pain for 2 days

EXAM:
CT ABDOMEN AND PELVIS WITHOUT CONTRAST
TECHNIQUE: Multidetector CT imaging of the abdomen and pelvis was performed
following the standard protocol without IV contrast.

[Series 3: stone study 5.0 i30f 2 · axial · 0.86mm/px · z∈[+701,+1146]mm · 14 of 99 slices shown, 16 images]
[im 5/99  soft-tissue]
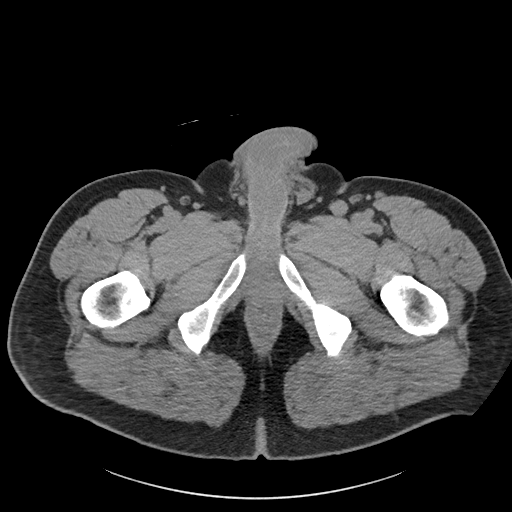
[im 5/99  bone]
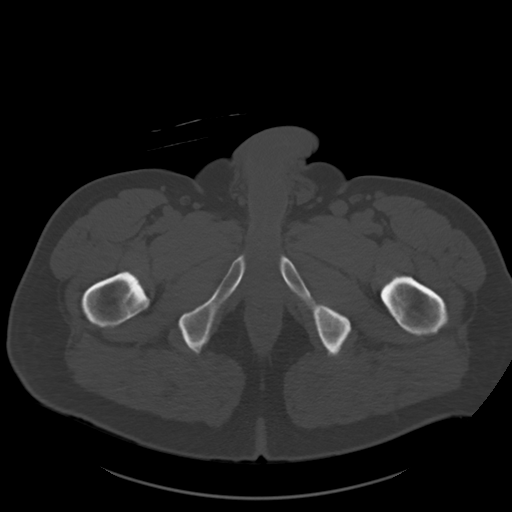
[im 13/99  soft-tissue]
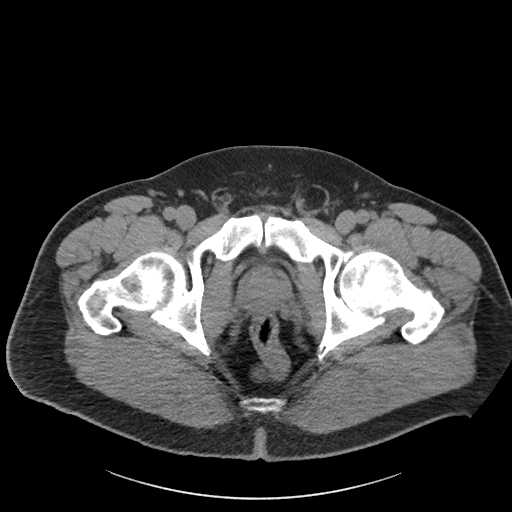
[im 21/99  soft-tissue]
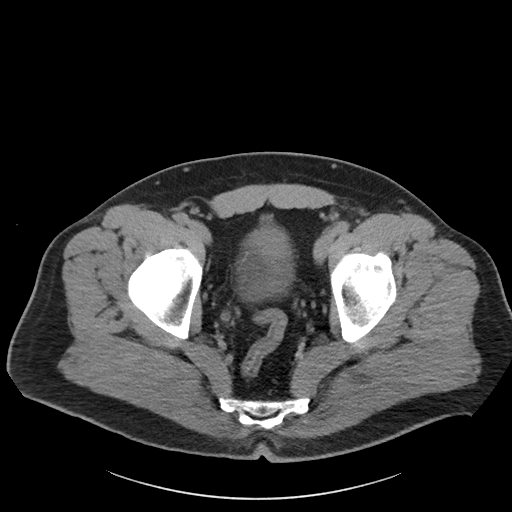
[im 25/99  soft-tissue]
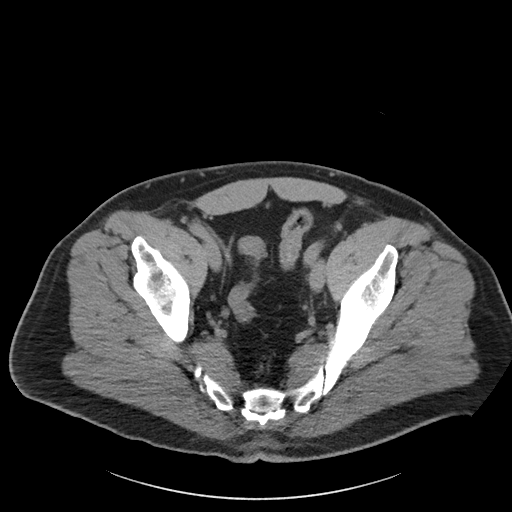
[im 33/99  soft-tissue]
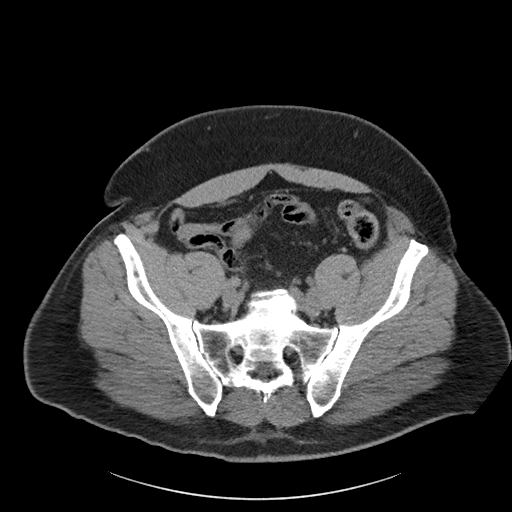
[im 41/99  soft-tissue]
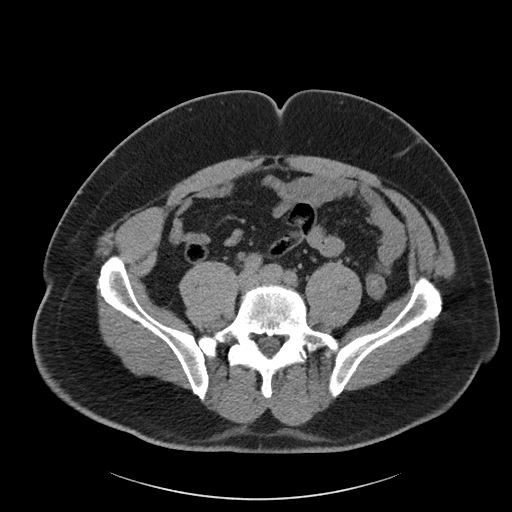
[im 45/99  soft-tissue]
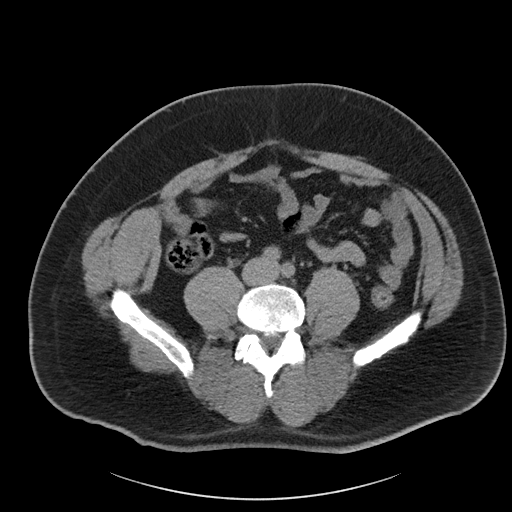
[im 54/99  soft-tissue]
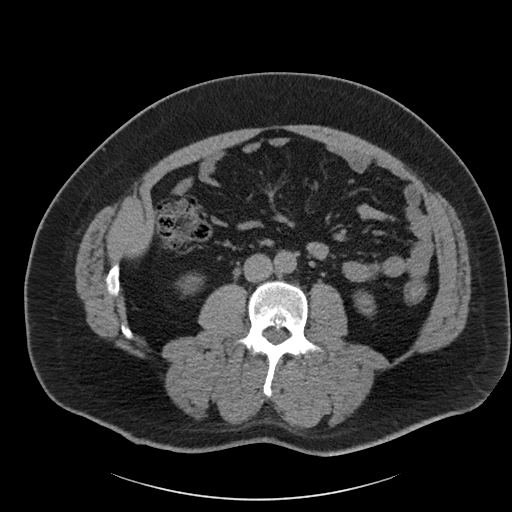
[im 58/99  soft-tissue]
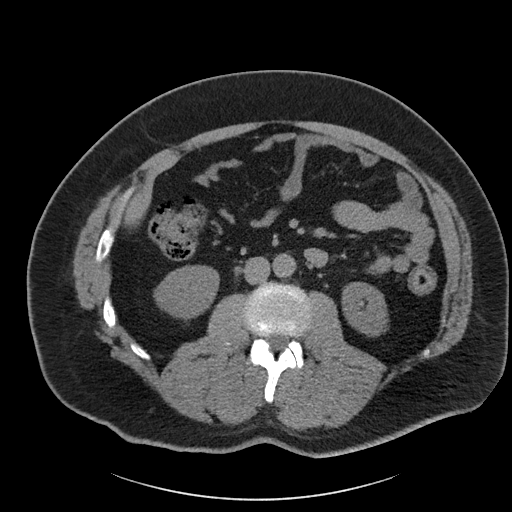
[im 58/99  bone]
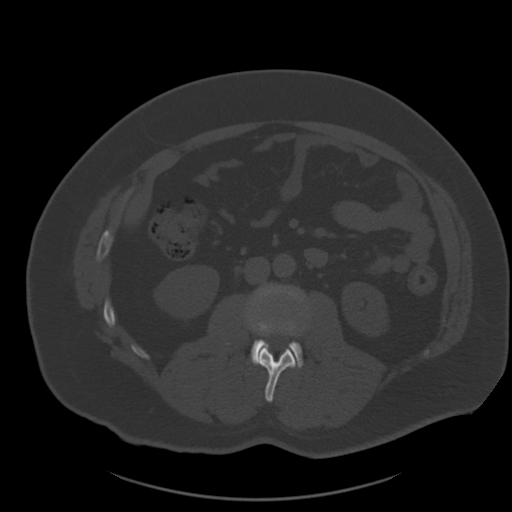
[im 66/99  soft-tissue]
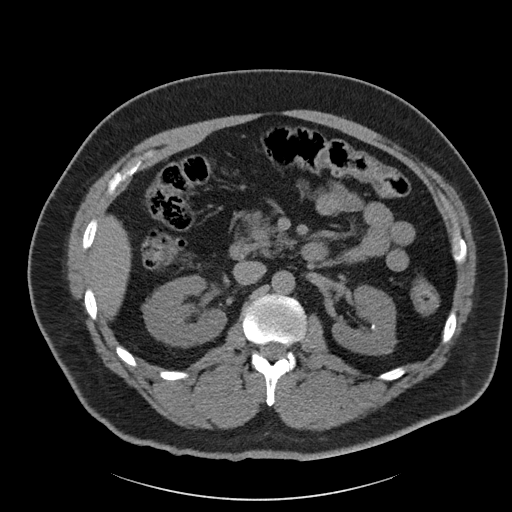
[im 74/99  soft-tissue]
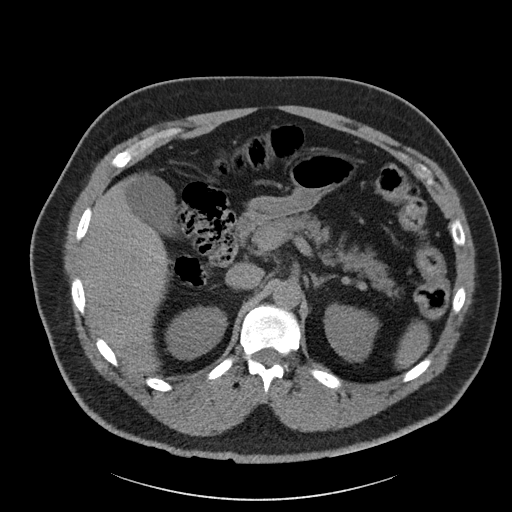
[im 78/99  soft-tissue]
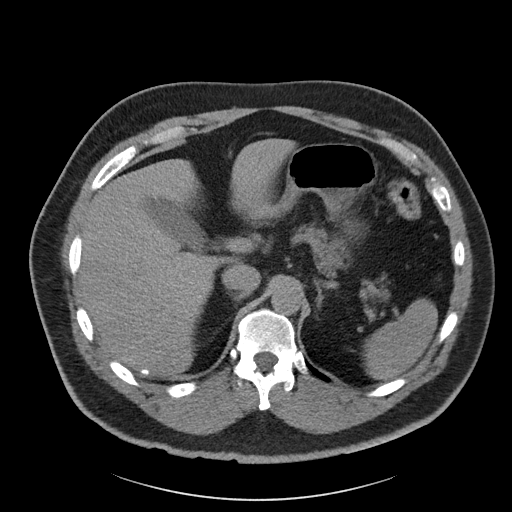
[im 86/99  soft-tissue]
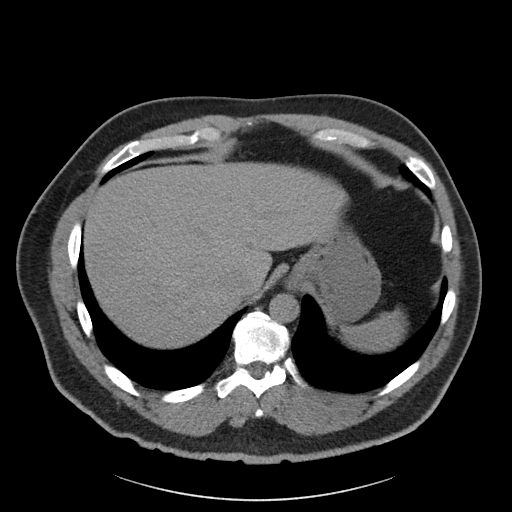
[im 94/99  soft-tissue]
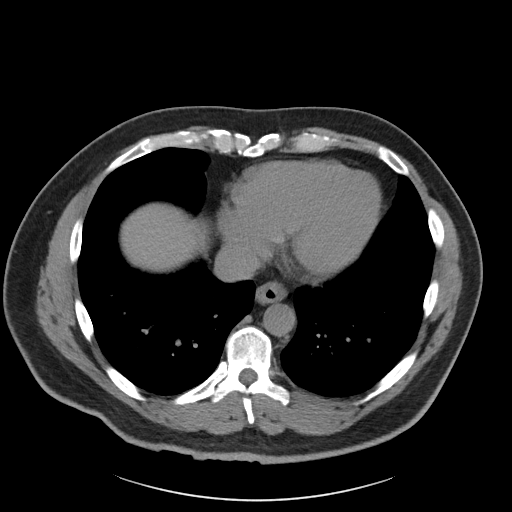

[Series 6: coronal soft tissue · coronal · 0.96mm/px · 3 of 120 slices shown]
[im 40/120  soft-tissue]
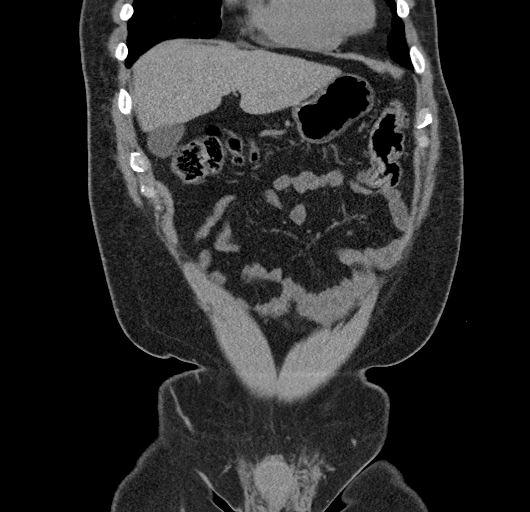
[im 53/120  soft-tissue]
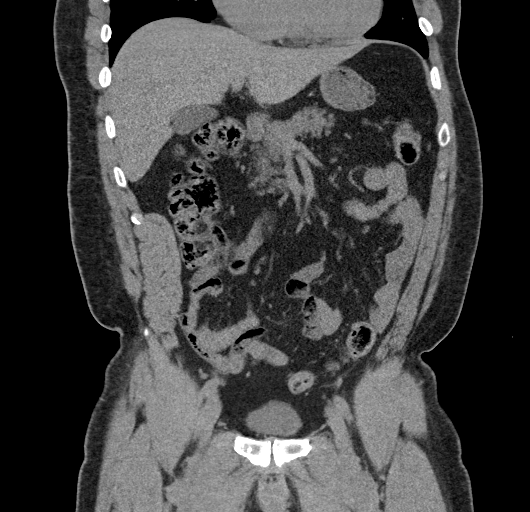
[im 67/120  soft-tissue]
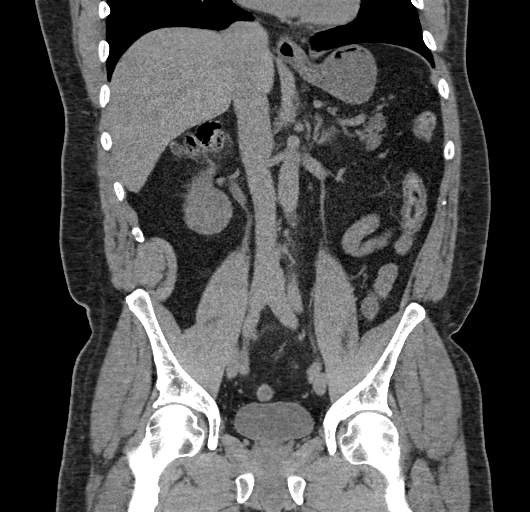

[17 of 46 positions shown; findings below may reference images not displayed]

FINDINGS: Lower chest: No acute abnormality.

Hepatobiliary: No focal liver abnormality is seen. No gallstones,
gallbladder wall thickening, or biliary dilatation.

Pancreas: Mild fatty infiltration of the pancreatic head and
uncinate process. No peripancreatic inflammatory stranding. No
pancreatic ductal dilatation.

Spleen: Normal in size without focal abnormality.

Adrenals/Urinary Tract: Unremarkable adrenal glands. 6 x 3 mm
calculus at the right ureterovesical junction resulting in mild
right hydroureteronephrosis. Nonobstructing 3 mm calculus within the
midpole of the left kidney. No left-sided hydronephrosis. Left
ureter is unremarkable. Urinary bladder is unremarkable for the
degree of distension.

Stomach/Bowel: Small hiatal hernia. Stomach is otherwise within
normal limits. Appendix is not visualized and may be surgically
absent. No evidence of bowel wall thickening, distention, or
inflammatory changes.

Vascular/Lymphatic: No significant vascular findings are present. No
enlarged abdominal or pelvic lymph nodes.

Reproductive: Prostate is unremarkable.

Other: No abdominal wall hernia or abnormality. No abdominopelvic
ascites.

Musculoskeletal: No acute or significant osseous findings.
Degenerative disc disease of L5-S1.
IMPRESSION: 1. Obstructing 6 mm calculus at the right UVJ resulting in mild
right hydroureteronephrosis.
2. Nonobstructing 3 mm calculus within the midpole of the left
kidney.

## 2021-04-24 ENCOUNTER — Other Ambulatory Visit: Payer: Self-pay

## 2021-04-24 ENCOUNTER — Ambulatory Visit (HOSPITAL_COMMUNITY)
Admission: RE | Admit: 2021-04-24 | Discharge: 2021-04-24 | Disposition: A | Discharge status: Home / Self Care | Payer: 59 | Source: Ambulatory Visit | Attending: Vascular Surgery | Admitting: Vascular Surgery

## 2021-04-24 DIAGNOSIS — I872 Venous insufficiency (chronic) (peripheral): Secondary | ICD-10-CM | POA: Diagnosis present

## 2021-04-25 ENCOUNTER — Ambulatory Visit: Payer: 59 | Admitting: Vascular Surgery

## 2021-04-25 ENCOUNTER — Encounter: Payer: Self-pay | Admitting: Vascular Surgery

## 2021-04-25 VITALS — BP 129/81 | HR 72 | Temp 98.3°F | Resp 18 | Ht 74.0 in | Wt 266.0 lb

## 2021-04-25 DIAGNOSIS — I83812 Varicose veins of left lower extremities with pain: Secondary | ICD-10-CM | POA: Diagnosis not present

## 2021-04-25 NOTE — Progress Notes (Signed)
? ?ASSESSMENT & PLAN  ? ?PAINFUL VARICOSE VEINS LEFT LOWER EXTREMITY: This patient has painful varicose veins of the left lower extremity with both deep and superficial venous reflux.  He has had a couple bleeding episodes from varicosities on the lateral aspect of his left leg.  He has CEAP C2 venous disease.  We have discussed the importance of intermittent leg elevation and the proper positioning for this.  In addition I recommended that he use knee-high compression stockings with a gradient of 15 to 20 mmHg.  If these are not helpful then I think he should try thigh-high stockings with a gradient of 20 to 30 mmHg.  I have also encouraged him to avoid prolonged sitting and standing.  We discussed importance of exercise specifically walking and water aerobics.  We also discussed the importance of maintaining a healthy weight.  I encouraged him to keep his skin well lubricated.  He does know what to do if he has any further bleeding episodes.  He knows to elevate his leg and hold spot pressure over the area of concern.  If his symptoms progress he will let us know and we will get him in some thigh-high stockings.  If that were not effective I think he would be a good candidate for laser ablation of the left great saphenous vein and 10-20 stabs.  I would likely cannulate the vein just below the knee. ? ?REASON FOR CONSULT:   ? ?Painful varicose veins of the left leg.  The consult is requested by Dr. Shirline Frees. ? ?HPI:  ? ?Roy Jenkins is a 56 y.o. male who is referred with painful varicose veins of the left lower extremity.  I have reviewed the records from the referring office.  The patient was seen on 03/09/2021.  He was complaining of soreness over his varicose veins in the left leg.  He apparently had a bleeding episode from these in the past.  He was concerned about his varicosities and was set up for vascular consultation. ? ?On my history this patient does describe some aching pain and pressure in his  legs after he has been bowling.  Otherwise he does not have significant symptoms in his legs.  He has had some swelling in both legs especially on the left side.  He has had no previous history of DVT.  Has had no previous venous procedures.  He has not been elevating his legs.  He does not wear compression stockings. ? ?Past Medical History:  ?Diagnosis Date  ? Hyperlipidemia   ? Kidney stones   ? ? ?History reviewed. No pertinent family history. ? ?SOCIAL HISTORY: ?Social History  ? ?Tobacco Use  ? Smoking status: Former  ?  Types: Cigarettes  ?  Quit date: 02/26/1992  ?  Years since quitting: 29.1  ? Smokeless tobacco: Never  ?Substance Use Topics  ? Alcohol use: Yes  ?  Alcohol/week: 4.0 - 6.0 standard drinks  ?  Types: 2 - 3 Glasses of wine, 2 - 3 Cans of beer per week  ? ? ?No Known Allergies ? ?Current Outpatient Medications  ?Medication Sig Dispense Refill  ? benzonatate (TESSALON) 100 MG capsule Take 1 capsule (100 mg total) by mouth every 8 (eight) hours. 21 capsule 0  ? Cholecalciferol (VITAMIN D) 2000 UNITS tablet Take 2,000 Units by mouth daily.    ? HYDROcodone-acetaminophen (NORCO/VICODIN) 5-325 MG tablet Take 1-2 tablets by mouth every 4 (four) hours as needed for severe pain. 10 tablet 0  ?  Omega-3 Fatty Acids (FISH OIL) 1000 MG CAPS Take 1,000 mg by mouth daily.    ? PredniSONE 5 MG KIT 12 days dosepack po (Patient taking differently: Take 5 mg by mouth. 12 days dosepack po) 1 kit 0  ? triamcinolone cream (KENALOG) 0.1 % Apply 1 application topically 2 (two) times daily. 30 g 0  ? ?No current facility-administered medications for this visit.  ? ? ?REVIEW OF SYSTEMS:  ?'[X]'  denotes positive finding, '[ ]'  denotes negative finding ?Cardiac  Comments:  ?Chest pain or chest pressure:    ?Shortness of breath upon exertion:    ?Short of breath when lying flat:    ?Irregular heart rhythm:    ?    ?Vascular    ?Pain in calf, thigh, or hip brought on by ambulation:    ?Pain in feet at night that wakes you up  from your sleep:     ?Blood clot in your veins:    ?Leg swelling:  x   ?    ?Pulmonary    ?Oxygen at home:    ?Productive cough:     ?Wheezing:     ?    ?Neurologic    ?Sudden weakness in arms or legs:     ?Sudden numbness in arms or legs:     ?Sudden onset of difficulty speaking or slurred speech:    ?Temporary loss of vision in one eye:     ?Problems with dizziness:     ?    ?Gastrointestinal    ?Blood in stool:     ?Vomited blood:     ?    ?Genitourinary    ?Burning when urinating:     ?Blood in urine:    ?    ?Psychiatric    ?Major depression:     ?    ?Hematologic    ?Bleeding problems:    ?Problems with blood clotting too easily:    ?    ?Skin    ?Rashes or ulcers:    ?    ?Constitutional    ?Fever or chills:    ?- ? ?PHYSICAL EXAM:  ? ?Vitals:  ? 04/25/21 1413  ?BP: 129/81  ?Pulse: 72  ?Resp: 18  ?Temp: 98.3 ?F (36.8 ?C)  ?TempSrc: Temporal  ?SpO2: 99%  ?Weight: 266 lb (120.7 kg)  ?Height: '6\' 2"'  (1.88 m)  ? ?Body mass index is 34.15 kg/m?. ?GENERAL: The patient is a well-nourished male, in no acute distress. The vital signs are documented above. ?CARDIAC: There is a regular rate and rhythm.  ?VASCULAR: I do not detect carotid bruits. ?He has palpable pedal pulses. ?He has a large cluster of varicose veins in the medial aspect of his left thigh and some spider veins in his left leg. ? ? ? ? ? ? ?I did look at his left great saphenous vein myself with the SonoSite and he has reflux down to the knee.  The vein is markedly dilated.  I would likely cannulate the vein just below the knee. ?PULMONARY: There is good air exchange bilaterally without wheezing or rales. ?ABDOMEN: Soft and non-tender with normal pitched bowel sounds.  ?MUSCULOSKELETAL: There are no major deformities. ?NEUROLOGIC: No focal weakness or paresthesias are detected. ?SKIN: There are no ulcers or rashes noted. ?PSYCHIATRIC: The patient has a normal affect. ? ?DATA:   ? ?VENOUS DUPLEX: I have independently interpreted his venous duplex scan that  was done yesterday.  This was of the left lower extremity only. ? ?There was no  evidence of DVT. ? ?There was deep venous reflux in the common femoral vein. ? ?There was superficial venous reflux in the great saphenous vein from the saphenofemoral junction to the proximal calf.  Diameters of the vein ranged from 7 to 8 mm. ? ? ? ? ? ? ?Deitra Mayo ?[facility] ?

## 2021-04-27 ENCOUNTER — Encounter: Payer: Self-pay | Admitting: Vascular Surgery

## 2021-04-27 ENCOUNTER — Encounter (HOSPITAL_COMMUNITY): Payer: Self-pay

## 2022-04-10 ENCOUNTER — Ambulatory Visit: Payer: 59 | Admitting: Vascular Surgery

## 2022-04-10 ENCOUNTER — Encounter: Payer: Self-pay | Admitting: Vascular Surgery

## 2022-04-10 VITALS — BP 116/70 | HR 69 | Temp 98.4°F | Resp 16 | Ht 74.0 in | Wt 264.0 lb

## 2022-04-10 DIAGNOSIS — I83812 Varicose veins of left lower extremities with pain: Secondary | ICD-10-CM | POA: Diagnosis not present

## 2022-04-10 NOTE — Progress Notes (Signed)
REASON FOR VISIT:      MEDICAL ISSUES:   CHRONIC VENOUS INSUFFICIENCY: This patient has C2 venous disease.  His symptoms have improved significantly as he has been wearing his compression stockings which definitely helped.  In addition he has lost weight and this has also helped.  I have encouraged him to continue to elevate his legs daily.  He will continue to wear his compression stockings.  He would like to continue to lose some more weight.  If his symptoms progress then based on my assessment of his saphenous vein I think he would be an excellent candidate for laser ablation of the left great saphenous vein with 10-20 stabs.  The vein could be cannulated in the distal thigh or proximal calf.  He will call if his symptoms progress.  Of note his last study was on 04/24/2021 and for this reason he would need a follow-up reflux study on the left.  He is having no symptoms on the right side.  HPI:   Roy Jenkins is a pleasant 57 y.o. male who I saw with painful varicose veins of the left lower extremity on 04/25/2021.  He had both deep and superficial venous reflux.  He had 2 bleeding episodes from varicosities along the lateral aspect of the left leg.  We discussed conservative measures including leg elevation and compression stockings.  I encouraged him to avoid prolonged sitting and standing and to exercise.  I felt that if his symptoms progressed he would be a good candidate for laser ablation of the left great saphenous vein and 10-20 stabs.  He comes in for a follow-up visit.  Since I saw the patient last, his symptoms have improved.  He has been faithfully wearing his compression stockings which definitely helped the symptoms.  He is also lost some weight which has helped.  He remains fairly active.  He denies any previous history of DVT or phlebitis.  Has had no further bleeding episodes.  Past Medical History:  Diagnosis Date   Hyperlipidemia    Kidney stones     History reviewed. No  pertinent family history.  SOCIAL HISTORY: Social History   Tobacco Use   Smoking status: Former    Types: Cigarettes    Quit date: 02/26/1992    Years since quitting: 30.1   Smokeless tobacco: Never  Substance Use Topics   Alcohol use: Yes    Alcohol/week: 4.0 - 6.0 standard drinks of alcohol    Types: 2 - 3 Glasses of wine, 2 - 3 Cans of beer per week    No Known Allergies  Current Outpatient Medications  Medication Sig Dispense Refill   benzonatate (TESSALON) 100 MG capsule Take 1 capsule (100 mg total) by mouth every 8 (eight) hours. 21 capsule 0   Cholecalciferol (VITAMIN D) 2000 UNITS tablet Take 2,000 Units by mouth daily.     HYDROcodone-acetaminophen (NORCO/VICODIN) 5-325 MG tablet Take 1-2 tablets by mouth every 4 (four) hours as needed for severe pain. 10 tablet 0   Omega-3 Fatty Acids (FISH OIL) 1000 MG CAPS Take 1,000 mg by mouth daily.     PredniSONE 5 MG KIT 12 days dosepack po (Patient taking differently: Take 5 mg by mouth. 12 days dosepack po) 1 kit 0   triamcinolone cream (KENALOG) 0.1 % Apply 1 application topically 2 (two) times daily. 30 g 0   No current facility-administered medications for this visit.    REVIEW OF SYSTEMS:  [X]$  denotes positive finding, [ ]$   denotes negative finding Cardiac  Comments:  Chest pain or chest pressure:    Shortness of breath upon exertion:    Short of breath when lying flat:    Irregular heart rhythm:        Vascular    Pain in calf, thigh, or hip brought on by ambulation:    Pain in feet at night that wakes you up from your sleep:     Blood clot in your veins:    Leg swelling:         Pulmonary    Oxygen at home:    Productive cough:     Wheezing:         Neurologic    Sudden weakness in arms or legs:     Sudden numbness in arms or legs:     Sudden onset of difficulty speaking or slurred speech:    Temporary loss of vision in one eye:     Problems with dizziness:         Gastrointestinal    Blood in stool:      Vomited blood:         Genitourinary    Burning when urinating:     Blood in urine:        Psychiatric    Major depression:         Hematologic    Bleeding problems:    Problems with blood clotting too easily:        Skin    Rashes or ulcers:        Constitutional    Fever or chills:     PHYSICAL EXAM:   Vitals:   04/10/22 1541  BP: 116/70  Pulse: 69  Resp: 16  Temp: 98.4 F (36.9 C)  TempSrc: Temporal  SpO2: 98%  Weight: 264 lb (119.7 kg)  Height: 6' 2"$  (1.88 m)    GENERAL: The patient is a well-nourished male, in no acute distress. The vital signs are documented above. CARDIAC: There is a regular rate and rhythm.  VASCULAR: I do not detect carotid bruits. He has palpable pedal pulses. I looked at his left great saphenous vein myself with the SonoSite.  It is markedly dilated throughout the entire thigh with reflux down to the proximal calf. There is a large cluster of varicose veins in the medial left calf as noted in the photograph below.  PULMONARY: There is good air exchange bilaterally without wheezing or rales. ABDOMEN: Soft and non-tender with normal pitched bowel sounds.  MUSCULOSKELETAL: There are no major deformities or cyanosis. NEUROLOGIC: No focal weakness or paresthesias are detected. SKIN: There are no ulcers or rashes noted. PSYCHIATRIC: The patient has a normal affect.  DATA:    VENOUS DUPLEX: I reviewed his previous venous duplex scan that was done on 04/24/2021.  The results are summarized on the diagram below.  He had deep venous reflux in the common femoral vein only.  He had significant superficial venous reflux in the left great saphenous vein down to the proximal calf.  Diameters of the vein ranged from 7 to 8 mm.    Deitra Mayo [facility] [phone]

## 2022-06-26 ENCOUNTER — Other Ambulatory Visit: Payer: Self-pay | Admitting: *Deleted

## 2022-06-26 DIAGNOSIS — I83812 Varicose veins of left lower extremities with pain: Secondary | ICD-10-CM

## 2022-07-03 ENCOUNTER — Ambulatory Visit: Payer: 59 | Admitting: Vascular Surgery

## 2022-07-03 ENCOUNTER — Encounter: Payer: Self-pay | Admitting: Vascular Surgery

## 2022-07-03 ENCOUNTER — Ambulatory Visit (HOSPITAL_COMMUNITY)
Admission: RE | Admit: 2022-07-03 | Discharge: 2022-07-03 | Disposition: A | Discharge status: Home / Self Care | Payer: 59 | Source: Ambulatory Visit | Attending: Vascular Surgery | Admitting: Vascular Surgery

## 2022-07-03 VITALS — BP 125/74 | HR 54 | Temp 98.4°F | Resp 16 | Ht 74.0 in | Wt 270.2 lb

## 2022-07-03 DIAGNOSIS — I872 Venous insufficiency (chronic) (peripheral): Secondary | ICD-10-CM | POA: Diagnosis not present

## 2022-07-03 DIAGNOSIS — I83812 Varicose veins of left lower extremities with pain: Secondary | ICD-10-CM

## 2022-07-03 NOTE — Progress Notes (Signed)
REASON FOR VISIT:    Follow-up of chronic venous insufficiency.  MEDICAL ISSUES:   CHRONIC VENOUS INSUFFICIENCY: This patient has C2 venous disease with painful varicose veins of the left lower extremity.  He is also had some bleeding from some small varicose veins along the lateral thigh.  He has failed conservative measures including thigh-high compression stockings with a gradient of 20 to 30 mmHg, elevation, and exercise.  I think he would be a good candidate for laser ablation of the left great saphenous vein with 10-20 stabs.  I think he would also require 2 units of sclerotherapy to address the telangiectasias where he had a bleeding problem.  I have discussed the indications for endovenous laser ablation of the left GSV, that is to lower the pressure in the veins and potentially help relieve the symptoms from venous hypertension.  I have also discussed alternative options such as conservative treatment as described above. I have discussed the potential complications of the procedure, including bleeding, bruising, leg swelling, deep venous thrombosis (<1% risk), or failure of the vein to close <1% risk).  I have also explained that venous insufficiency is a chronic disease, and that the patient is at risk for recurrent varicose veins in the future.  All of the patient's questions were encouraged and answered. They are agreeable to proceed.   I have discussed with the patient the indications for stab phlebectomy.  I have explained to the patient that that will have small scars from the stab incisions.  I explained that the other risks include bruising, bleeding, and phlebitis.   HPI:   Roy Jenkins is a pleasant 57 y.o. male who I last saw on 04/10/2022.  He has C2 venous disease.  He had 2 bleeding episodes from from some varicosities along the lateral aspect of his left leg.  He has symptomatic varicose veins.  We discussed conservative measures at that time.  He comes in for a follow-up  visit.  Since I saw him last, he has been wearing his thigh-high compression stockings which do help some but not significantly.  He recently had a long trip and surgery that increased his symptoms in his leg and resulted in some significant left leg swelling.  He does try to elevate his legs.  He had no further bleeding episodes.  Past Medical History:  Diagnosis Date   Hyperlipidemia    Kidney stones     History reviewed. No pertinent family history.  SOCIAL HISTORY: Social History   Tobacco Use   Smoking status: Former    Types: Cigarettes    Quit date: 02/26/1992    Years since quitting: 30.3   Smokeless tobacco: Never  Substance Use Topics   Alcohol use: Yes    Alcohol/week: 4.0 - 6.0 standard drinks of alcohol    Types: 2 - 3 Glasses of wine, 2 - 3 Cans of beer per week    No Known Allergies  Current Outpatient Medications  Medication Sig Dispense Refill   benzonatate (TESSALON) 100 MG capsule Take 1 capsule (100 mg total) by mouth every 8 (eight) hours. 21 capsule 0   Cholecalciferol (VITAMIN D) 2000 UNITS tablet Take 2,000 Units by mouth daily.     HYDROcodone-acetaminophen (NORCO/VICODIN) 5-325 MG tablet Take 1-2 tablets by mouth every 4 (four) hours as needed for severe pain. 10 tablet 0   Omega-3 Fatty Acids (FISH OIL) 1000 MG CAPS Take 1,000 mg by mouth daily.     PredniSONE 5 MG KIT  12 days dosepack po (Patient taking differently: Take 5 mg by mouth. 12 days dosepack po) 1 kit 0   triamcinolone cream (KENALOG) 0.1 % Apply 1 application topically 2 (two) times daily. 30 g 0   No current facility-administered medications for this visit.    REVIEW OF SYSTEMS:  [X]  denotes positive finding, [ ]  denotes negative finding Cardiac  Comments:  Chest pain or chest pressure:    Shortness of breath upon exertion:    Short of breath when lying flat:    Irregular heart rhythm:        Vascular    Pain in calf, thigh, or hip brought on by ambulation:    Pain in feet at  night that wakes you up from your sleep:     Blood clot in your veins:    Leg swelling:  x       Pulmonary    Oxygen at home:    Productive cough:     Wheezing:         Neurologic    Sudden weakness in arms or legs:     Sudden numbness in arms or legs:     Sudden onset of difficulty speaking or slurred speech:    Temporary loss of vision in one eye:     Problems with dizziness:         Gastrointestinal    Blood in stool:     Vomited blood:         Genitourinary    Burning when urinating:     Blood in urine:        Psychiatric    Major depression:         Hematologic    Bleeding problems:    Problems with blood clotting too easily:        Skin    Rashes or ulcers:        Constitutional    Fever or chills:     PHYSICAL EXAM:   Vitals:   07/03/22 1252  BP: 125/74  Pulse: (!) 54  Resp: 16  Temp: 98.4 F (36.9 C)  TempSrc: Temporal  SpO2: 97%  Weight: 270 lb 3.2 oz (122.6 kg)  Height: 6\' 2"  (1.88 m)   GENERAL: The patient is a well-nourished male, in no acute distress. The vital signs are documented above. CARDIAC: There is a regular rate and rhythm.  VASCULAR: He has palpable pedal pulses. I did look again at his left great saphenous vein which is significantly dilated with reflux down to the proximal calf where it feeds these large clusters of varicose veins.     PULMONARY: There is good air exchange bilaterally without wheezing or rales. ABDOMEN: Soft and non-tender with normal pitched bowel sounds.  MUSCULOSKELETAL: There are no major deformities or cyanosis. NEUROLOGIC: No focal weakness or paresthesias are detected. SKIN: There are no ulcers or rashes noted. PSYCHIATRIC: The patient has a normal affect.  DATA:    VENOUS DUPLEX: I have independently interpreted his venous duplex scan today.  This was of the left lower extremity only.  There is no evidence of DVT.  There was deep venous reflux in the common femoral vein only.  There was superficial  venous reflux in the left great saphenous vein from the saphenofemoral junction to the proximal calf.  Diameters of the vein ranged from 7 to 9 mm.  Results of the study are summarized in the diagram below.    Waverly Ferrari [facility]  [employer] [phone]

## 2022-07-16 ENCOUNTER — Other Ambulatory Visit: Payer: Self-pay | Admitting: *Deleted

## 2022-07-16 DIAGNOSIS — I83812 Varicose veins of left lower extremities with pain: Secondary | ICD-10-CM

## 2022-08-23 ENCOUNTER — Other Ambulatory Visit: Payer: Self-pay | Admitting: *Deleted

## 2022-08-23 MED ORDER — LORAZEPAM 1 MG PO TABS: ORAL_TABLET | ORAL | 0 refills | Status: DC

## 2022-08-28 ENCOUNTER — Ambulatory Visit: Payer: 59 | Admitting: Vascular Surgery

## 2022-08-28 ENCOUNTER — Encounter: Payer: Self-pay | Admitting: Vascular Surgery

## 2022-08-28 VITALS — BP 153/81 | HR 78 | Temp 97.6°F | Resp 15 | Ht 74.0 in | Wt 272.0 lb

## 2022-08-28 DIAGNOSIS — I83812 Varicose veins of left lower extremities with pain: Secondary | ICD-10-CM | POA: Diagnosis not present

## 2022-08-28 HISTORY — PX: LASER ABLATION: SHX1947

## 2022-08-28 NOTE — Addendum Note (Signed)
Addended by: Everlean Cherry on: 08/28/2022 11:07 AM   Modules accepted: Orders

## 2022-08-28 NOTE — Progress Notes (Signed)
Patient name: Roy Jenkins MRN: [id number] DOB: Oct 05, 1965 Sex: male  REASON FOR VISIT: For laser ablation of the left great saphenous vein in 10-20 stabs.  HPI: Roy Jenkins is a 57 y.o. male who I last saw on 07/03/2022.  He has C2 venous disease of the left lower extremity.  He also had some bleeding from some small varicose veins along the lateral thigh.  He has failed conservative treatment including thigh-high compression stockings, elevation, and exercise.  I felt that immediate he would be a good candidate for laser ablation of the left great saphenous vein with 10-20 stabs.  He would also require 2 units of sclerotherapy for the telangiectasias where he had the bleeding problem.  When I looked at his vein it was significantly dilated with reflux down to the proximal calf where that these large clusters of varicose veins.  Current Outpatient Medications  Medication Sig Dispense Refill   benzonatate (TESSALON) 100 MG capsule Take 1 capsule (100 mg total) by mouth every 8 (eight) hours. 21 capsule 0   Cholecalciferol (VITAMIN D) 2000 UNITS tablet Take 2,000 Units by mouth daily.     HYDROcodone-acetaminophen (NORCO/VICODIN) 5-325 MG tablet Take 1-2 tablets by mouth every 4 (four) hours as needed for severe pain. 10 tablet 0   LORazepam (ATIVAN) 1 MG tablet Take 2 tablets 30 minutes before leaving house on day of office surgery.  Bring third tablet with you to office. 3 tablet 0   Omega-3 Fatty Acids (FISH OIL) 1000 MG CAPS Take 1,000 mg by mouth daily.     PredniSONE 5 MG KIT 12 days dosepack po (Patient taking differently: Take 5 mg by mouth. 12 days dosepack po) 1 kit 0   triamcinolone cream (KENALOG) 0.1 % Apply 1 application topically 2 (two) times daily. 30 g 0   No current facility-administered medications for this visit.    PHYSICAL EXAM: There were no vitals filed for this visit.  PROCEDURE: Laser ablation left great saphenous vein with 10-20 stabs  TECHNIQUE: The patient was  taken to the exam room and the dilated varicose veins were marked with the patient standing.  The patient was then placed supine on the exam table.  I looked at the left great saphenous vein myself with the SonoSite and I felt that I could cannulate this in the proximal calf.  The left leg was prepped and draped in usual sterile fashion.  The left great saphenous vein was cannulated in the proximal calf with a micropuncture needle and a micropuncture sheath was introduced over a wire.  I then advanced the J-wire to just below the saphenofemoral junction.  The 65 cm sheath was advanced over the wire and the wire and dilator removed.  The laser fiber was positioned at the end of the sheath and the sheath retracted.  I position the laser fiber 2.5 cm distal to the saphenofemoral junction.  Tumescent anesthesia was then administered circumferentially around the vein.  Laser ablation was performed of the left great saphenous vein from 2.5 cm distal to the saphenofemoral junction to the proximal calf.  50 J/cm was used 7 W.  Attention was then turned to stab phlebectomy.  All marked areas were anesthetized with tumescent anesthesia.  Using approximately 20 small stab incisions the vein was hooked brought above above the skin and then grasped and bluntly excised.  Pressure was held for hemostasis.  Steri-Strips were applied.  A pressure dressing was applied.  Patient tolerated the procedure well.  He will return in 2 weeks for a follow-up duplex.  Waverly Ferrari [facility] [place] [phone]

## 2022-08-28 NOTE — Progress Notes (Signed)
     Laser Ablation Procedure    Date: 08/28/2022   Roy Jenkins DOB:Apr 26, 1965  Consent signed: Yes      Surgeon: Dr. Waverly Ferrari  Procedure: Laser Ablation: left Greater Saphenous Vein  BP (!) 153/81 (BP Location: Right Arm, Patient Position: Sitting, Cuff Size: Normal)   Pulse 78   Temp 97.6 F (36.4 C) (Temporal)   Resp 15   Ht 6\' 2"  (1.88 m)   Wt 272 lb (123.4 kg)   SpO2 99%   BMI 34.92 kg/m   Tumescent Anesthesia: 550 cc 0.9% NaCl with 50 cc Lidocaine HCL 1%  and 15 cc 8.4% NaHCO3  Local Anesthesia: 2 cc Lidocaine HCL and NaHCO3 (ratio 2:1)  7 watts continuous mode     Total energy: 1894 joules    Total time: 270 sec Treatment Length: 37 cm   Laser Fiber Ref. # [id number]     Lot # [id number]    Stab Phlebectomy: 10-20 Sites: Calf  Patient tolerated procedure well  Notes: All staff members wore facial masks Description of Procedure  Patient felt he didn't require Ativan for procedure.  After marking the course of the secondary varicosities, the patient was placed on the operating table in the supine position, and the left leg was prepped and draped in sterile fashion.   Local anesthetic was administered and under ultrasound guidance the saphenous vein was accessed with a micro needle and guide wire; then the mirco puncture sheath was placed.  A guide wire was inserted saphenofemoral junction , followed by a 5 french sheath.  The position of the sheath and then the laser fiber below the junction was confirmed using the ultrasound.  Tumescent anesthesia was administered along the course of the saphenous vein using ultrasound guidance. The patient was placed in Trendelenburg position and protective laser glasses were placed on patient and staff, and the laser was fired at 7 watts continuous mode for a total of 1894 joules.   For stab phlebectomies, local anesthetic was administered at the previously marked varicosities, and tumescent anesthesia was administered  around the vessels.  Ten to 20 stab wounds were made using the tip of an 11 blade. And using the vein hook, the phlebectomies were performed using a hemostat to avulse the varicosities.  Adequate hemostasis was achieved.    Steri strips were applied to the stab wounds and ABD pads and thigh high compression stockings were applied.  Ace wrap bandages were applied over the phlebectomy sites and at the top of the saphenofemoral junction. Blood loss was less than 15 cc.  Discharge instructions reviewed with patient and hardcopy of discharge instructions given to patient to take home. The patient ambulated out of the operating room having tolerated the procedure well.

## 2022-09-11 ENCOUNTER — Ambulatory Visit (HOSPITAL_COMMUNITY)
Admission: RE | Admit: 2022-09-11 | Discharge: 2022-09-11 | Disposition: A | Discharge status: Home / Self Care | Payer: 59 | Source: Ambulatory Visit | Attending: Vascular Surgery | Admitting: Vascular Surgery

## 2022-09-11 ENCOUNTER — Encounter: Payer: Self-pay | Admitting: Vascular Surgery

## 2022-09-11 ENCOUNTER — Ambulatory Visit (INDEPENDENT_AMBULATORY_CARE_PROVIDER_SITE_OTHER): Payer: 59 | Admitting: Vascular Surgery

## 2022-09-11 VITALS — BP 125/83 | HR 73 | Temp 98.4°F | Resp 18 | Ht 74.0 in | Wt 268.9 lb

## 2022-09-11 DIAGNOSIS — I83812 Varicose veins of left lower extremities with pain: Secondary | ICD-10-CM

## 2022-09-11 NOTE — Progress Notes (Signed)
   Patient name: Roy Jenkins MRN: [id number] DOB: [birth year] Sex: male  REASON FOR VISIT: Follow-up after laser ablation of the left great saphenous vein with 10-20 stabs  HPI: Roy Jenkins is a 57 y.o. male who presented with C2 venous disease.  He had bleeding from some small varicose veins along the lateral thigh.  He had failed conservative treatment.  On 08/28/2022 he underwent laser ablation of the left great saphenous vein down to the proximal calf.  He also had 10-20 stabs.  He comes in for a 2-week follow-up visit.  Today, he has no specific complaints.  He denies chest pain or shortness of breath.  He had no significant leg swelling.  He has been wearing his thigh-high stockings for 2 weeks now.  No current outpatient medications on file.   No current facility-administered medications for this visit.   REVIEW OF SYSTEMS: Arly.Keller ] denotes positive finding; [  ] denotes negative finding  CARDIOVASCULAR:  [ ]  chest pain   [ ]  dyspnea on exertion  [ ]  leg swelling  CONSTITUTIONAL:  [ ]  fever   [ ]  chills  PHYSICAL EXAM: Vitals:   09/11/22 1012  BP: 125/83  Pulse: 73  Resp: 18  Temp: 98.4 F (36.9 C)  TempSrc: Temporal  SpO2: 98%  Weight: 268 lb 14.4 oz (122 kg)  Height: 6\' 2"  (1.88 m)   GENERAL: The patient is a well-nourished male, in no acute distress. The vital signs are documented above. CARDIOVASCULAR: There is a regular rate and rhythm. PULMONARY: There is good air exchange bilaterally without wheezing or rales. VASCULAR: He has no significant left leg swelling.  He has no significant bruising.  His incisions are healing nicely.  DATA:  VENOUS DUPLEX: I have independently interpreted his venous duplex scan today peters no evidence of DVT in the left lower extremity.  The left great saphenous vein is successfully closed up to the saphenofemoral junction.  MEDICAL ISSUES:  S/P LASER ABLATION LEFT GREAT SAPHENOUS VEIN: Patient is doing well status post laser ablation of  the left great saphenous vein with 10-20 stabs.  He has worn his thigh-high compression stockings for 2 weeks.  I have encouraged him to continue to exercise and elevate his legs daily.  He has no significant symptoms on the right side.  I will see him as needed.  Waverly Ferrari [facility] [phone]

## 2023-03-14 ENCOUNTER — Encounter (HOSPITAL_COMMUNITY): Payer: Self-pay | Admitting: *Deleted

## 2023-03-14 ENCOUNTER — Ambulatory Visit (HOSPITAL_COMMUNITY)
Admission: EM | Admit: 2023-03-14 | Discharge: 2023-03-14 | Disposition: A | Discharge status: Home / Self Care | Payer: BC Managed Care – PPO | Attending: Family Medicine | Admitting: Family Medicine

## 2023-03-14 DIAGNOSIS — S61011A Laceration without foreign body of right thumb without damage to nail, initial encounter: Secondary | ICD-10-CM

## 2023-03-14 MED ORDER — LIDOCAINE HCL (PF) 1 % IJ SOLN
INTRAMUSCULAR | Status: AC
  Filled 2023-03-14: qty 2

## 2023-03-14 NOTE — Discharge Instructions (Signed)
I have put 2 sutures in your cut  1-2 times daily please wash the wound with soapy water and then put new antibiotic ointment and a Band-Aid on.  Sutures need to be removed in 7 to 10 days.  You can make an online appointment for that.  We are open 8 -8 Monday through Friday and Saturday and Sunday we are open 10-6.

## 2023-03-14 NOTE — ED Triage Notes (Signed)
Pt states he cut his right thumb on a box cutter about an hour ago. There is no bleeding currently. Last TDAP 06/22/2019

## 2023-03-14 NOTE — ED Provider Notes (Signed)
[facility]    CSN: [id number] Arrival date & time: 03/14/23  1920      History   Chief Complaint Chief Complaint  Patient presents with   Laceration    HPI Roy Jenkins is a 58 y.o. male.    Laceration  Here for cut to his left thumb over the interphalangeal joint.  He was using a box cutter today and it skipped across his knuckle. Last tetanus was in April 2021  NKDA    Past Medical History:  Diagnosis Date   Hyperlipidemia    Kidney stones     There are no active problems to display for this patient.   Past Surgical History:  Procedure Laterality Date   APPENDECTOMY     LASER ABLATION Left 08/28/2022   Laser ablation of Left greater saphenous vein with 10-20 stab phlebectomies  by Dr. Edilia Bo   LITHOTRIPSY         Home Medications    Prior to Admission medications   Not on File    Family History History reviewed. No pertinent family history.  Social History Social History   Tobacco Use   Smoking status: Former    Current packs/day: 0.00    Types: Cigarettes    Quit date: 02/26/1992    Years since quitting: 31.0   Smokeless tobacco: Never  Vaping Use   Vaping status: Never Used  Substance Use Topics   Alcohol use: Yes    Alcohol/week: 4.0 - 6.0 standard drinks of alcohol    Types: 2 - 3 Glasses of wine, 2 - 3 Cans of beer per week   Drug use: No     Allergies   Patient has no known allergies.   Review of Systems Review of Systems   Physical Exam Triage Vital Signs ED Triage Vitals  Encounter Vitals Group     BP 03/14/23 1931 120/72     Systolic BP Percentile --      Diastolic BP Percentile --      Pulse Rate 03/14/23 1931 69     Resp 03/14/23 1931 18     Temp 03/14/23 1931 97.8 F (36.6 C)     Temp Source 03/14/23 1931 Oral     SpO2 03/14/23 1931 94 %     Weight --      Height --      Head Circumference --      Peak Flow --      Pain Score 03/14/23 1930 0     Pain Loc --      Pain Education --       Exclude from Growth Chart --    No data found.  Updated Vital Signs BP 120/72 (BP Location: Right Arm)   Pulse 69   Temp 97.8 F (36.6 C) (Oral)   Resp 18   SpO2 94%   Visual Acuity Right Eye Distance:   Left Eye Distance:   Bilateral Distance:    Right Eye Near:   Left Eye Near:    Bilateral Near:     Physical Exam Vitals reviewed.  Constitutional:      General: He is not in acute distress.    Appearance: He is not ill-appearing, toxic-appearing or diaphoretic.  Skin:    Coloration: Skin is not pale.     Comments: There is a 1 cm laceration over the dorsum of the PIP joint on the right thumb.  It extends another centimeter but is very shallow and that part.  Capillary refill is normal.  Neurological:     Mental Status: He is alert and oriented to person, place, and time.  Psychiatric:        Behavior: Behavior normal.      UC Treatments / Results  Labs (all labs ordered are listed, but only abnormal results are displayed) Labs Reviewed - No data to display  EKG   Radiology No results found.  Procedures Procedures (including critical care time)  Medications Ordered in UC Medications - No data to display  Initial Impression / Assessment and Plan / UC Course  I have reviewed the triage vital signs and the nursing notes.  Pertinent labs & imaging results that were available during my care of the patient were reviewed by me and considered in my medical decision making (see chart for details).     Risk and benefits are discussed and verbal consent is given for laceration repair.  Wound was cleansed and under clean conditions 1% lidocaine plain is used for infiltration anesthesia in the laceration.  Good anesthesia is obtained.  Under clean conditions 4-0 nylon is used to place 2 sutures with good approximation of the skin edges.  Bacitracin and bandage are applied.  Wound care is explained.  He will return in 7 to 10 days for suture removal. Final Clinical  Impressions(s) / UC Diagnoses   Final diagnoses:  Laceration of right thumb without foreign body without damage to nail, initial encounter     Discharge Instructions      I have put 2 sutures in your cut  1-2 times daily please wash the wound with soapy water and then put new antibiotic ointment and a Band-Aid on.  Sutures need to be removed in 7 to 10 days.  You can make an online appointment for that.  We are open 8 -8 Monday through Friday and Saturday and Sunday we are open 10-6.    ED Prescriptions   None    PDMP not reviewed this encounter.   Zenia Resides, MD 03/14/23 2045

## 2023-03-24 ENCOUNTER — Encounter (HOSPITAL_COMMUNITY): Payer: Self-pay

## 2023-03-24 ENCOUNTER — Ambulatory Visit (HOSPITAL_COMMUNITY)
Admission: RE | Admit: 2023-03-24 | Discharge: 2023-03-24 | Disposition: A | Discharge status: Home / Self Care | Payer: BC Managed Care – PPO | Source: Ambulatory Visit | Attending: Family Medicine | Admitting: Family Medicine

## 2023-03-24 DIAGNOSIS — Z4802 Encounter for removal of sutures: Secondary | ICD-10-CM

## 2023-03-24 NOTE — ED Triage Notes (Addendum)
Pt here for suture removal right thumb. Sutures intact,no redness or swelling at site.

## 2023-04-14 DIAGNOSIS — M109 Gout, unspecified: Secondary | ICD-10-CM | POA: Diagnosis not present

## 2023-04-14 DIAGNOSIS — Z125 Encounter for screening for malignant neoplasm of prostate: Secondary | ICD-10-CM | POA: Diagnosis not present

## 2023-04-14 DIAGNOSIS — E7800 Pure hypercholesterolemia, unspecified: Secondary | ICD-10-CM | POA: Diagnosis not present

## 2023-04-14 DIAGNOSIS — Z0000 Encounter for general adult medical examination without abnormal findings: Secondary | ICD-10-CM | POA: Diagnosis not present

## 2023-04-28 ENCOUNTER — Telehealth (INDEPENDENT_AMBULATORY_CARE_PROVIDER_SITE_OTHER): Payer: Self-pay | Admitting: Otolaryngology

## 2023-04-28 NOTE — Telephone Encounter (Signed)
 Confirmed appt and address with patient for 04/29/2023.

## 2023-04-29 ENCOUNTER — Encounter (INDEPENDENT_AMBULATORY_CARE_PROVIDER_SITE_OTHER): Payer: Self-pay

## 2023-04-29 ENCOUNTER — Ambulatory Visit (INDEPENDENT_AMBULATORY_CARE_PROVIDER_SITE_OTHER): Payer: BC Managed Care – PPO

## 2023-04-29 VITALS — BP 144/80 | HR 63 | Ht 74.0 in | Wt 268.0 lb

## 2023-04-29 DIAGNOSIS — J310 Chronic rhinitis: Secondary | ICD-10-CM | POA: Diagnosis not present

## 2023-04-29 DIAGNOSIS — R0981 Nasal congestion: Secondary | ICD-10-CM

## 2023-04-29 DIAGNOSIS — R0982 Postnasal drip: Secondary | ICD-10-CM | POA: Diagnosis not present

## 2023-04-29 DIAGNOSIS — J343 Hypertrophy of nasal turbinates: Secondary | ICD-10-CM | POA: Diagnosis not present

## 2023-04-29 MED ORDER — IPRATROPIUM BROMIDE 0.06 % NA SOLN: 2.0000 | Freq: Two times a day (BID) | NASAL | 12 refills | Status: AC | PRN

## 2023-04-30 DIAGNOSIS — R0982 Postnasal drip: Secondary | ICD-10-CM | POA: Insufficient documentation

## 2023-04-30 DIAGNOSIS — J310 Chronic rhinitis: Secondary | ICD-10-CM | POA: Insufficient documentation

## 2023-04-30 DIAGNOSIS — J343 Hypertrophy of nasal turbinates: Secondary | ICD-10-CM | POA: Insufficient documentation

## 2023-04-30 NOTE — Progress Notes (Signed)
 Patient ID: Roy Jenkins, male   DOB: Jul 25, 1965, 58 y.o.   MRN: [id number]  CC: Chronic postnasal drainage  HPI:  Roy Jenkins is a 58 y.o. male who presents today complaining of chronic postnasal drainage for the past 4 months.  The postnasal drainage is worse at night.  He has a history of infrequent sinusitis.  He denies any significant environmental allergies.  He has no previous ENT surgery.  He is currently using Zyrtec daily.  He denies any facial pain, fever, or visual change.  Past Medical History:  Diagnosis Date   Hyperlipidemia    Kidney stones     Past Surgical History:  Procedure Laterality Date   APPENDECTOMY     LASER ABLATION Left 08/28/2022   Laser ablation of Left greater saphenous vein with 10-20 stab phlebectomies  by Dr. Edilia Bo   LITHOTRIPSY      History reviewed. No pertinent family history.  Social History:  reports that he quit smoking about 31 years ago. His smoking use included cigarettes. He has never used smokeless tobacco. He reports current alcohol use of about 4.0 - 6.0 standard drinks of alcohol per week. He reports that he does not use drugs.  Allergies: No Known Allergies  Prior to Admission medications   Medication Sig Start Date End Date Taking? Authorizing Provider  ipratropium (ATROVENT) 0.06 % nasal spray Place 2 sprays into both nostrils 2 (two) times daily as needed. 04/29/23 05/29/23 Yes Newman Pies, MD    Blood pressure (!) 144/80, pulse 63, height 6\' 2"  (1.88 m), weight 268 lb (121.6 kg), SpO2 96%. Exam: General: Communicates without difficulty, well nourished, no acute distress. Head: Normocephalic, no evidence injury, no tenderness, facial buttresses intact without stepoff. Face/sinus: No tenderness to palpation and percussion. Facial movement is normal and symmetric. Eyes: PERRL, EOMI. No scleral icterus, conjunctivae clear. Neuro: CN II exam reveals vision grossly intact.  No nystagmus at any point of gaze. Ears: Auricles well formed  without lesions.  Ear canals are intact without mass or lesion.  No erythema or edema is appreciated.  The TMs are intact without fluid. Nose: External evaluation reveals normal support and skin without lesions.  Dorsum is intact.  Anterior rhinoscopy reveals congested mucosa over anterior aspect of inferior turbinates and intact septum.  No purulence noted. Oral:  Oral cavity and oropharynx are intact, symmetric, without erythema or edema.  Mucosa is moist without lesions. Neck: Full range of motion without pain.  There is no significant lymphadenopathy.  No masses palpable.  Thyroid bed within normal limits to palpation.  Parotid glands and submandibular glands equal bilaterally without mass.  Trachea is midline. Neuro:  CN 2-12 grossly intact.   Procedure:  Flexible Nasal Endoscopy: Description: Risks, benefits, and alternatives of flexible endoscopy were explained to the patient.  Specific mention was made of the risk of throat numbness with difficulty swallowing, possible bleeding from the nose and mouth, and pain from the procedure.  The patient gave oral consent to proceed.  The flexible scope was inserted into the right nasal cavity.  Endoscopy of the interior nasal cavity, superior, inferior, and middle meatus was performed. The sphenoid-ethmoid recess was examined. Edematous mucosa was noted.  No polyp, mass, or lesion was appreciated. Olfactory cleft was clear.  Nasopharynx with postnasal drainage.  Turbinates were hypertrophied but without mass.  The procedure was repeated on the contralateral side with similar findings.  The patient tolerated the procedure well.   Assessment: 1.  Chronic rhinitis with  nasal mucosal congestion and bilateral inferior turbinate hypertrophy. 2.  Chronic postnasal drainage. 3.  No polyps, mass, lesion, or purulent drainage is noted today.  Plan: 1.  The physical exam and nasal endoscopy findings are reviewed with the patient. 2.  Atrovent nasal spray 2 sprays each  nostril twice daily as needed to treat the postnasal drainage. 3.  The patient is reassured that no infection or suspicious lesion is noted today. 4.  The patient will return for reevaluation in 2 months.  Roy Jenkins W Demiyah Fischbach 04/30/2023, 11:21 AM

## 2023-07-02 ENCOUNTER — Ambulatory Visit (INDEPENDENT_AMBULATORY_CARE_PROVIDER_SITE_OTHER)

## 2023-07-02 ENCOUNTER — Telehealth (INDEPENDENT_AMBULATORY_CARE_PROVIDER_SITE_OTHER): Payer: Self-pay | Admitting: Otolaryngology

## 2023-07-02 NOTE — Telephone Encounter (Signed)
 LVM to confirm appt & location [id number]

## 2023-07-23 DIAGNOSIS — M25532 Pain in left wrist: Secondary | ICD-10-CM | POA: Diagnosis not present

## 2023-07-23 DIAGNOSIS — M19132 Post-traumatic osteoarthritis, left wrist: Secondary | ICD-10-CM | POA: Diagnosis not present

## 2023-08-27 ENCOUNTER — Encounter (INDEPENDENT_AMBULATORY_CARE_PROVIDER_SITE_OTHER): Payer: Self-pay | Admitting: Otolaryngology

## 2023-08-27 ENCOUNTER — Ambulatory Visit (INDEPENDENT_AMBULATORY_CARE_PROVIDER_SITE_OTHER): Admitting: Otolaryngology

## 2023-08-27 VITALS — BP 114/73 | HR 60

## 2023-08-27 DIAGNOSIS — R0982 Postnasal drip: Secondary | ICD-10-CM

## 2023-08-27 DIAGNOSIS — J343 Hypertrophy of nasal turbinates: Secondary | ICD-10-CM | POA: Diagnosis not present

## 2023-08-27 DIAGNOSIS — J310 Chronic rhinitis: Secondary | ICD-10-CM

## 2023-08-27 DIAGNOSIS — R0981 Nasal congestion: Secondary | ICD-10-CM

## 2023-08-28 NOTE — Progress Notes (Signed)
 Patient ID: Roy Jenkins, male   DOB: [birth year], 58 y.o.   MRN: [id number]  Follow-up: Chronic postnasal drainage  HPI: The patient is a 58 year old male who returns today for his follow-up evaluation.  He was last seen in March 2025.  At that time, he was complaining of chronic postnasal drainage.  He was noted to have chronic rhinitis with bilateral inferior turbinate hypertrophy.  He was treated with Atrovent  nasal sprays.  The patient returns today reporting improvement in his nasal drainage with the use of Atrovent .  He denies any facial pain, fever, or visual change.  Exam: General: Communicates without difficulty, well nourished, no acute distress. Head: Normocephalic, no evidence injury, no tenderness, facial buttresses intact without stepoff. Face/sinus: No tenderness to palpation and percussion. Facial movement is normal and symmetric. Eyes: PERRL, EOMI. No scleral icterus, conjunctivae clear. Neuro: CN II exam reveals vision grossly intact.  No nystagmus at any point of gaze. Ears: Auricles well formed without lesions.  Ear canals are intact without mass or lesion.  No erythema or edema is appreciated.  The TMs are intact without fluid. Nose: External evaluation reveals normal support and skin without lesions.  Dorsum is intact.  Anterior rhinoscopy reveals congested mucosa over anterior aspect of inferior turbinates and intact septum.  No purulence noted. Oral:  Oral cavity and oropharynx are intact, symmetric, without erythema or edema.  Mucosa is moist without lesions. Neck: Full range of motion without pain.  There is no significant lymphadenopathy.  No masses palpable.  Thyroid bed within normal limits to palpation.  Parotid glands and submandibular glands equal bilaterally without mass.  Trachea is midline. Neuro:  CN 2-12 grossly intact.   Assessment: 1.  Chronic rhinitis with nasal mucosal congestion, bilateral inferior turbinate hypertrophy, and chronic postnasal drainage. 2.  His  symptoms have clinically improved with the medical treatment.  Plan: 1.  The physical exam findings are reviewed with the patient. 2.  Continue the use of Atrovent  as needed. 3.  Nasal saline irrigation is encouraged. 4.  The patient is encouraged to call with any questions or concerns.

## 2023-10-20 DIAGNOSIS — M25562 Pain in left knee: Secondary | ICD-10-CM | POA: Diagnosis not present

## 2023-10-28 DIAGNOSIS — M25562 Pain in left knee: Secondary | ICD-10-CM | POA: Diagnosis not present

## 2023-11-12 DIAGNOSIS — M25562 Pain in left knee: Secondary | ICD-10-CM | POA: Diagnosis not present

## 2023-11-17 DIAGNOSIS — M25562 Pain in left knee: Secondary | ICD-10-CM | POA: Diagnosis not present

## 2023-12-04 DIAGNOSIS — M2242 Chondromalacia patellae, left knee: Secondary | ICD-10-CM | POA: Diagnosis not present

## 2023-12-04 DIAGNOSIS — Y999 Unspecified external cause status: Secondary | ICD-10-CM | POA: Diagnosis not present

## 2023-12-04 DIAGNOSIS — M2342 Loose body in knee, left knee: Secondary | ICD-10-CM | POA: Diagnosis not present

## 2023-12-04 DIAGNOSIS — G8918 Other acute postprocedural pain: Secondary | ICD-10-CM | POA: Diagnosis not present

## 2023-12-04 DIAGNOSIS — X58XXXA Exposure to other specified factors, initial encounter: Secondary | ICD-10-CM | POA: Diagnosis not present

## 2023-12-04 DIAGNOSIS — S83232A Complex tear of medial meniscus, current injury, left knee, initial encounter: Secondary | ICD-10-CM | POA: Diagnosis not present

## 2023-12-17 DIAGNOSIS — S83242D Other tear of medial meniscus, current injury, left knee, subsequent encounter: Secondary | ICD-10-CM | POA: Diagnosis not present
